# Patient Record
Sex: Female | Born: 1935 | Race: White | Hispanic: No
Health system: Southern US, Community
[De-identification: ages and names within clinical notes are randomized; demographics above are authoritative.]

---

## 2008-03-17 ENCOUNTER — Other Ambulatory Visit: Payer: Self-pay

## 2008-03-17 ENCOUNTER — Inpatient Hospital Stay: Payer: Self-pay | Admitting: Internal Medicine

## 2008-05-15 ENCOUNTER — Inpatient Hospital Stay: Payer: Self-pay | Admitting: Vascular Surgery

## 2008-11-14 ENCOUNTER — Emergency Department: Payer: Self-pay | Admitting: Emergency Medicine

## 2009-12-19 ENCOUNTER — Inpatient Hospital Stay: Payer: Self-pay | Admitting: Student

## 2010-04-24 ENCOUNTER — Ambulatory Visit: Payer: Self-pay | Admitting: Gynecologic Oncology

## 2010-05-07 ENCOUNTER — Ambulatory Visit: Payer: Self-pay | Admitting: Gynecologic Oncology

## 2010-05-24 ENCOUNTER — Ambulatory Visit: Payer: Self-pay | Admitting: Gynecologic Oncology

## 2010-07-25 ENCOUNTER — Emergency Department: Payer: Self-pay | Admitting: Emergency Medicine

## 2010-08-24 ENCOUNTER — Ambulatory Visit: Payer: Self-pay | Admitting: Internal Medicine

## 2010-09-06 ENCOUNTER — Ambulatory Visit: Payer: Self-pay | Admitting: Internal Medicine

## 2010-09-24 ENCOUNTER — Ambulatory Visit: Payer: Self-pay | Admitting: Internal Medicine

## 2010-10-25 ENCOUNTER — Ambulatory Visit: Payer: Self-pay | Admitting: Internal Medicine

## 2010-11-18 ENCOUNTER — Inpatient Hospital Stay: Payer: Self-pay | Admitting: Specialist

## 2010-11-24 ENCOUNTER — Ambulatory Visit: Payer: Self-pay | Admitting: Internal Medicine

## 2010-12-22 ENCOUNTER — Emergency Department: Payer: Self-pay | Admitting: Emergency Medicine

## 2011-03-18 ENCOUNTER — Ambulatory Visit: Payer: Self-pay | Admitting: Internal Medicine

## 2011-03-25 ENCOUNTER — Ambulatory Visit: Payer: Self-pay | Admitting: Internal Medicine

## 2011-07-22 ENCOUNTER — Ambulatory Visit: Payer: Self-pay | Admitting: Gynecologic Oncology

## 2011-08-19 ENCOUNTER — Ambulatory Visit: Payer: Self-pay | Admitting: Gynecologic Oncology

## 2011-08-25 ENCOUNTER — Ambulatory Visit: Payer: Self-pay | Admitting: Gynecologic Oncology

## 2011-09-25 ENCOUNTER — Ambulatory Visit: Payer: Self-pay | Admitting: Gynecologic Oncology

## 2011-12-30 ENCOUNTER — Ambulatory Visit: Payer: Self-pay | Admitting: Gynecologic Oncology

## 2012-01-09 LAB — PATHOLOGY REPORT

## 2012-01-23 ENCOUNTER — Ambulatory Visit: Payer: Self-pay | Admitting: Gynecologic Oncology

## 2012-04-12 ENCOUNTER — Ambulatory Visit: Payer: Self-pay | Admitting: Rheumatology

## 2012-05-04 ENCOUNTER — Ambulatory Visit: Payer: Self-pay | Admitting: Internal Medicine

## 2012-05-05 LAB — CA 125: CA 125: 23.4 U/mL (ref 0.0–34.0)

## 2012-05-24 ENCOUNTER — Ambulatory Visit: Payer: Self-pay | Admitting: Internal Medicine

## 2012-07-23 ENCOUNTER — Ambulatory Visit: Payer: Self-pay | Admitting: Internal Medicine

## 2013-03-15 ENCOUNTER — Ambulatory Visit: Payer: Self-pay | Admitting: Physician Assistant

## 2013-03-17 ENCOUNTER — Ambulatory Visit: Payer: Self-pay | Admitting: Physician Assistant

## 2013-10-25 ENCOUNTER — Ambulatory Visit: Payer: Self-pay | Admitting: Gynecologic Oncology

## 2013-11-29 ENCOUNTER — Ambulatory Visit: Payer: Self-pay | Admitting: Gynecologic Oncology

## 2013-12-25 ENCOUNTER — Ambulatory Visit: Payer: Self-pay | Admitting: Gynecologic Oncology

## 2014-11-13 ENCOUNTER — Ambulatory Visit: Payer: Self-pay

## 2014-11-19 ENCOUNTER — Inpatient Hospital Stay: Payer: Self-pay | Admitting: Internal Medicine

## 2014-11-19 LAB — CBC
HCT: 37.5 % (ref 35.0–47.0)
HGB: 12.1 g/dL (ref 12.0–16.0)
MCH: 31.9 pg (ref 26.0–34.0)
MCHC: 32.3 g/dL (ref 32.0–36.0)
MCV: 99 fL (ref 80–100)
Platelet: 110 10*3/uL — ABNORMAL LOW (ref 150–440)
RBC: 3.8 10*6/uL (ref 3.80–5.20)
RDW: 14.5 % (ref 11.5–14.5)
WBC: 6.8 10*3/uL (ref 3.6–11.0)

## 2014-11-19 LAB — BASIC METABOLIC PANEL
Anion Gap: 6 — ABNORMAL LOW (ref 7–16)
BUN: 32 mg/dL — ABNORMAL HIGH (ref 7–18)
CALCIUM: 8.2 mg/dL — AB (ref 8.5–10.1)
CHLORIDE: 100 mmol/L (ref 98–107)
CO2: 35 mmol/L — AB (ref 21–32)
Creatinine: 2.74 mg/dL — ABNORMAL HIGH (ref 0.60–1.30)
EGFR (African American): 21 — ABNORMAL LOW
GFR CALC NON AF AMER: 18 — AB
Glucose: 96 mg/dL (ref 65–99)
Osmolality: 288 (ref 275–301)
Potassium: 4.1 mmol/L (ref 3.5–5.1)
Sodium: 141 mmol/L (ref 136–145)

## 2014-11-19 LAB — TROPONIN I: Troponin-I: 0.03 ng/mL

## 2014-11-20 LAB — CBC WITH DIFFERENTIAL/PLATELET
Basophil #: 0 10*3/uL (ref 0.0–0.1)
Basophil %: 0.1 %
Eosinophil #: 0 10*3/uL (ref 0.0–0.7)
Eosinophil %: 0.1 %
HCT: 31.6 % — ABNORMAL LOW (ref 35.0–47.0)
HGB: 10.3 g/dL — ABNORMAL LOW (ref 12.0–16.0)
LYMPHS PCT: 23.9 %
Lymphocyte #: 1.6 10*3/uL (ref 1.0–3.6)
MCH: 32.1 pg (ref 26.0–34.0)
MCHC: 32.7 g/dL (ref 32.0–36.0)
MCV: 98 fL (ref 80–100)
Monocyte #: 0.6 x10 3/mm (ref 0.2–0.9)
Monocyte %: 9.3 %
NEUTROS PCT: 66.6 %
Neutrophil #: 4.4 10*3/uL (ref 1.4–6.5)
PLATELETS: 93 10*3/uL — AB (ref 150–440)
RBC: 3.22 10*6/uL — AB (ref 3.80–5.20)
RDW: 14.7 % — ABNORMAL HIGH (ref 11.5–14.5)
WBC: 6.6 10*3/uL (ref 3.6–11.0)

## 2014-11-20 LAB — BASIC METABOLIC PANEL
ANION GAP: 4 — AB (ref 7–16)
BUN: 29 mg/dL — AB (ref 7–18)
CO2: 36 mmol/L — AB (ref 21–32)
Calcium, Total: 7.8 mg/dL — ABNORMAL LOW (ref 8.5–10.1)
Chloride: 105 mmol/L (ref 98–107)
Creatinine: 2.32 mg/dL — ABNORMAL HIGH (ref 0.60–1.30)
GFR CALC AF AMER: 26 — AB
GFR CALC NON AF AMER: 21 — AB
Glucose: 78 mg/dL (ref 65–99)
Osmolality: 293 (ref 275–301)
Potassium: 3.9 mmol/L (ref 3.5–5.1)
SODIUM: 145 mmol/L (ref 136–145)

## 2014-11-21 LAB — CBC WITH DIFFERENTIAL/PLATELET
BASOS PCT: 0.2 %
Basophil #: 0 10*3/uL (ref 0.0–0.1)
EOS PCT: 0.1 %
Eosinophil #: 0 10*3/uL (ref 0.0–0.7)
HCT: 29 % — ABNORMAL LOW (ref 35.0–47.0)
HGB: 9.4 g/dL — ABNORMAL LOW (ref 12.0–16.0)
LYMPHS PCT: 19.6 %
Lymphocyte #: 1.2 10*3/uL (ref 1.0–3.6)
MCH: 32.1 pg (ref 26.0–34.0)
MCHC: 32.4 g/dL (ref 32.0–36.0)
MCV: 99 fL (ref 80–100)
MONOS PCT: 7.4 %
Monocyte #: 0.5 x10 3/mm (ref 0.2–0.9)
Neutrophil #: 4.6 10*3/uL (ref 1.4–6.5)
Neutrophil %: 72.7 %
PLATELETS: 90 10*3/uL — AB (ref 150–440)
RBC: 2.93 10*6/uL — ABNORMAL LOW (ref 3.80–5.20)
RDW: 14.5 % (ref 11.5–14.5)
WBC: 6.3 10*3/uL (ref 3.6–11.0)

## 2014-11-21 LAB — BASIC METABOLIC PANEL
Anion Gap: 4 — ABNORMAL LOW (ref 7–16)
BUN: 28 mg/dL — ABNORMAL HIGH (ref 7–18)
Calcium, Total: 7 mg/dL — CL (ref 8.5–10.1)
Chloride: 108 mmol/L — ABNORMAL HIGH (ref 98–107)
Co2: 33 mmol/L — ABNORMAL HIGH (ref 21–32)
Creatinine: 1.97 mg/dL — ABNORMAL HIGH (ref 0.60–1.30)
GFR CALC AF AMER: 31 — AB
GFR CALC NON AF AMER: 26 — AB
Glucose: 78 mg/dL (ref 65–99)
Osmolality: 293 (ref 275–301)
Potassium: 3.8 mmol/L (ref 3.5–5.1)
Sodium: 145 mmol/L (ref 136–145)

## 2014-11-22 LAB — CBC WITH DIFFERENTIAL/PLATELET
BASOS ABS: 0 10*3/uL (ref 0.0–0.1)
Basophil %: 0.1 %
Eosinophil #: 0 10*3/uL (ref 0.0–0.7)
Eosinophil %: 0 %
HCT: 30 % — ABNORMAL LOW (ref 35.0–47.0)
HGB: 9.6 g/dL — ABNORMAL LOW (ref 12.0–16.0)
LYMPHS ABS: 0.4 10*3/uL — AB (ref 1.0–3.6)
LYMPHS PCT: 7.4 %
MCH: 32 pg (ref 26.0–34.0)
MCHC: 32.1 g/dL (ref 32.0–36.0)
MCV: 100 fL (ref 80–100)
MONO ABS: 0.2 x10 3/mm (ref 0.2–0.9)
Monocyte %: 2.9 %
Neutrophil #: 4.8 10*3/uL (ref 1.4–6.5)
Neutrophil %: 89.6 %
Platelet: 86 10*3/uL — ABNORMAL LOW (ref 150–440)
RBC: 3.01 10*6/uL — ABNORMAL LOW (ref 3.80–5.20)
RDW: 14.5 % (ref 11.5–14.5)
WBC: 5.3 10*3/uL (ref 3.6–11.0)

## 2014-11-22 LAB — HEPATIC FUNCTION PANEL A (ARMC)
ALBUMIN: 1.8 g/dL — AB (ref 3.4–5.0)
ALK PHOS: 38 U/L — AB
BILIRUBIN TOTAL: 0.4 mg/dL (ref 0.2–1.0)
Bilirubin, Direct: <0.1 mg/dL (ref 0.0–0.2)
SGOT(AST): 16 U/L (ref 15–37)
SGPT (ALT): 13 U/L — ABNORMAL LOW
Total Protein: 4.6 g/dL — ABNORMAL LOW (ref 6.4–8.2)

## 2014-11-22 LAB — BASIC METABOLIC PANEL
Anion Gap: 4 — ABNORMAL LOW (ref 7–16)
BUN: 25 mg/dL — ABNORMAL HIGH (ref 7–18)
CHLORIDE: 109 mmol/L — AB (ref 98–107)
Calcium, Total: 7 mg/dL — CL (ref 8.5–10.1)
Co2: 30 mmol/L (ref 21–32)
Creatinine: 1.57 mg/dL — ABNORMAL HIGH (ref 0.60–1.30)
EGFR (African American): 41 — ABNORMAL LOW
EGFR (Non-African Amer.): 34 — ABNORMAL LOW
Glucose: 125 mg/dL — ABNORMAL HIGH (ref 65–99)
OSMOLALITY: 291 (ref 275–301)
Potassium: 4.3 mmol/L (ref 3.5–5.1)
Sodium: 143 mmol/L (ref 136–145)

## 2014-11-23 LAB — BASIC METABOLIC PANEL
ANION GAP: 5 — AB (ref 7–16)
BUN: 33 mg/dL — AB (ref 7–18)
CREATININE: 1.56 mg/dL — AB (ref 0.60–1.30)
Calcium, Total: 7.4 mg/dL — ABNORMAL LOW (ref 8.5–10.1)
Chloride: 105 mmol/L (ref 98–107)
Co2: 29 mmol/L (ref 21–32)
EGFR (Non-African Amer.): 34 — ABNORMAL LOW
GFR CALC AF AMER: 41 — AB
Glucose: 121 mg/dL — ABNORMAL HIGH (ref 65–99)
OSMOLALITY: 286 (ref 275–301)
POTASSIUM: 4.5 mmol/L (ref 3.5–5.1)
Sodium: 139 mmol/L (ref 136–145)

## 2014-11-24 ENCOUNTER — Ambulatory Visit
Admit: 2014-11-24 | Disposition: A | Discharge status: Home / Self Care | Payer: Self-pay | Attending: Nurse Practitioner | Admitting: Nurse Practitioner

## 2014-11-24 ENCOUNTER — Ambulatory Visit: Payer: Self-pay | Admitting: Internal Medicine

## 2014-11-24 DIAGNOSIS — N179 Acute kidney failure, unspecified: Secondary | ICD-10-CM | POA: Diagnosis not present

## 2014-11-24 DIAGNOSIS — M069 Rheumatoid arthritis, unspecified: Secondary | ICD-10-CM | POA: Diagnosis not present

## 2014-11-24 DIAGNOSIS — J449 Chronic obstructive pulmonary disease, unspecified: Secondary | ICD-10-CM | POA: Diagnosis not present

## 2014-11-24 DIAGNOSIS — J15212 Pneumonia due to Methicillin resistant Staphylococcus aureus: Secondary | ICD-10-CM | POA: Diagnosis not present

## 2014-11-24 DIAGNOSIS — J9809 Other diseases of bronchus, not elsewhere classified: Secondary | ICD-10-CM | POA: Diagnosis not present

## 2014-11-24 DIAGNOSIS — J9600 Acute respiratory failure, unspecified whether with hypoxia or hypercapnia: Secondary | ICD-10-CM | POA: Diagnosis not present

## 2014-11-24 DIAGNOSIS — R1312 Dysphagia, oropharyngeal phase: Secondary | ICD-10-CM | POA: Diagnosis not present

## 2014-11-24 LAB — CULTURE, BLOOD (SINGLE)

## 2014-11-25 DIAGNOSIS — R1312 Dysphagia, oropharyngeal phase: Secondary | ICD-10-CM | POA: Diagnosis not present

## 2014-11-25 DIAGNOSIS — M069 Rheumatoid arthritis, unspecified: Secondary | ICD-10-CM | POA: Diagnosis not present

## 2014-11-25 DIAGNOSIS — J9809 Other diseases of bronchus, not elsewhere classified: Secondary | ICD-10-CM | POA: Diagnosis not present

## 2014-11-25 DIAGNOSIS — N179 Acute kidney failure, unspecified: Secondary | ICD-10-CM | POA: Diagnosis not present

## 2014-11-25 LAB — BASIC METABOLIC PANEL
Anion Gap: 2 — ABNORMAL LOW (ref 7–16)
BUN: 42 mg/dL — AB (ref 7–18)
CHLORIDE: 104 mmol/L (ref 98–107)
CO2: 34 mmol/L — AB (ref 21–32)
Calcium, Total: 7.9 mg/dL — ABNORMAL LOW (ref 8.5–10.1)
Creatinine: 1.73 mg/dL — ABNORMAL HIGH (ref 0.60–1.30)
EGFR (African American): 36 — ABNORMAL LOW
EGFR (Non-African Amer.): 30 — ABNORMAL LOW
Glucose: 75 mg/dL (ref 65–99)
OSMOLALITY: 289 (ref 275–301)
POTASSIUM: 5 mmol/L (ref 3.5–5.1)
Sodium: 140 mmol/L (ref 136–145)

## 2014-11-26 DIAGNOSIS — R1312 Dysphagia, oropharyngeal phase: Secondary | ICD-10-CM | POA: Diagnosis not present

## 2014-11-26 DIAGNOSIS — N179 Acute kidney failure, unspecified: Secondary | ICD-10-CM | POA: Diagnosis not present

## 2014-11-26 DIAGNOSIS — M069 Rheumatoid arthritis, unspecified: Secondary | ICD-10-CM | POA: Diagnosis not present

## 2014-11-26 DIAGNOSIS — J9809 Other diseases of bronchus, not elsewhere classified: Secondary | ICD-10-CM | POA: Diagnosis not present

## 2014-11-26 LAB — CREATININE, SERUM
CREATININE: 1.67 mg/dL — AB (ref 0.60–1.30)
GFR CALC AF AMER: 38 — AB
GFR CALC NON AF AMER: 31 — AB

## 2014-11-27 DIAGNOSIS — J9809 Other diseases of bronchus, not elsewhere classified: Secondary | ICD-10-CM | POA: Diagnosis not present

## 2014-11-27 DIAGNOSIS — M069 Rheumatoid arthritis, unspecified: Secondary | ICD-10-CM | POA: Diagnosis not present

## 2014-11-27 DIAGNOSIS — R1312 Dysphagia, oropharyngeal phase: Secondary | ICD-10-CM | POA: Diagnosis not present

## 2014-11-27 DIAGNOSIS — R1314 Dysphagia, pharyngoesophageal phase: Secondary | ICD-10-CM | POA: Diagnosis not present

## 2014-11-27 DIAGNOSIS — J15212 Pneumonia due to Methicillin resistant Staphylococcus aureus: Secondary | ICD-10-CM | POA: Diagnosis not present

## 2014-11-27 DIAGNOSIS — N179 Acute kidney failure, unspecified: Secondary | ICD-10-CM | POA: Diagnosis not present

## 2014-11-27 DIAGNOSIS — Z9981 Dependence on supplemental oxygen: Secondary | ICD-10-CM | POA: Diagnosis not present

## 2014-11-28 DIAGNOSIS — J9690 Respiratory failure, unspecified, unspecified whether with hypoxia or hypercapnia: Secondary | ICD-10-CM | POA: Diagnosis not present

## 2014-11-28 DIAGNOSIS — R1314 Dysphagia, pharyngoesophageal phase: Secondary | ICD-10-CM | POA: Diagnosis not present

## 2014-11-28 DIAGNOSIS — H269 Unspecified cataract: Secondary | ICD-10-CM | POA: Diagnosis not present

## 2014-11-28 DIAGNOSIS — D649 Anemia, unspecified: Secondary | ICD-10-CM | POA: Diagnosis not present

## 2014-11-28 DIAGNOSIS — Z9013 Acquired absence of bilateral breasts and nipples: Secondary | ICD-10-CM | POA: Diagnosis not present

## 2014-11-28 DIAGNOSIS — K5900 Constipation, unspecified: Secondary | ICD-10-CM | POA: Diagnosis not present

## 2014-11-28 DIAGNOSIS — Z803 Family history of malignant neoplasm of breast: Secondary | ICD-10-CM | POA: Diagnosis not present

## 2014-11-28 DIAGNOSIS — J9809 Other diseases of bronchus, not elsewhere classified: Secondary | ICD-10-CM | POA: Diagnosis not present

## 2014-11-28 DIAGNOSIS — Z86718 Personal history of other venous thrombosis and embolism: Secondary | ICD-10-CM | POA: Diagnosis not present

## 2014-11-28 DIAGNOSIS — K449 Diaphragmatic hernia without obstruction or gangrene: Secondary | ICD-10-CM | POA: Diagnosis not present

## 2014-11-28 DIAGNOSIS — R0602 Shortness of breath: Secondary | ICD-10-CM | POA: Diagnosis not present

## 2014-11-28 DIAGNOSIS — Z8249 Family history of ischemic heart disease and other diseases of the circulatory system: Secondary | ICD-10-CM | POA: Diagnosis not present

## 2014-11-28 DIAGNOSIS — I10 Essential (primary) hypertension: Secondary | ICD-10-CM | POA: Diagnosis not present

## 2014-11-28 DIAGNOSIS — I2510 Atherosclerotic heart disease of native coronary artery without angina pectoris: Secondary | ICD-10-CM | POA: Diagnosis not present

## 2014-11-28 DIAGNOSIS — S37099S Other injury of unspecified kidney, sequela: Secondary | ICD-10-CM | POA: Diagnosis not present

## 2014-11-28 DIAGNOSIS — Z853 Personal history of malignant neoplasm of breast: Secondary | ICD-10-CM | POA: Diagnosis not present

## 2014-11-28 DIAGNOSIS — Z9981 Dependence on supplemental oxygen: Secondary | ICD-10-CM | POA: Diagnosis not present

## 2014-11-28 DIAGNOSIS — I129 Hypertensive chronic kidney disease with stage 1 through stage 4 chronic kidney disease, or unspecified chronic kidney disease: Secondary | ICD-10-CM | POA: Diagnosis not present

## 2014-11-28 DIAGNOSIS — R05 Cough: Secondary | ICD-10-CM | POA: Diagnosis not present

## 2014-11-28 DIAGNOSIS — I517 Cardiomegaly: Secondary | ICD-10-CM | POA: Diagnosis not present

## 2014-11-28 DIAGNOSIS — M1991 Primary osteoarthritis, unspecified site: Secondary | ICD-10-CM | POA: Diagnosis not present

## 2014-11-28 DIAGNOSIS — I509 Heart failure, unspecified: Secondary | ICD-10-CM | POA: Diagnosis not present

## 2014-11-28 DIAGNOSIS — J988 Other specified respiratory disorders: Secondary | ICD-10-CM | POA: Diagnosis not present

## 2014-11-28 DIAGNOSIS — N19 Unspecified kidney failure: Secondary | ICD-10-CM | POA: Diagnosis not present

## 2014-11-28 DIAGNOSIS — K219 Gastro-esophageal reflux disease without esophagitis: Secondary | ICD-10-CM | POA: Diagnosis not present

## 2014-11-28 DIAGNOSIS — Z823 Family history of stroke: Secondary | ICD-10-CM | POA: Diagnosis not present

## 2014-11-28 DIAGNOSIS — Z8701 Personal history of pneumonia (recurrent): Secondary | ICD-10-CM | POA: Diagnosis not present

## 2014-11-28 DIAGNOSIS — I5031 Acute diastolic (congestive) heart failure: Secondary | ICD-10-CM | POA: Diagnosis not present

## 2014-11-28 DIAGNOSIS — J189 Pneumonia, unspecified organism: Secondary | ICD-10-CM | POA: Diagnosis not present

## 2014-11-28 DIAGNOSIS — J449 Chronic obstructive pulmonary disease, unspecified: Secondary | ICD-10-CM | POA: Diagnosis not present

## 2014-11-28 DIAGNOSIS — N182 Chronic kidney disease, stage 2 (mild): Secondary | ICD-10-CM | POA: Diagnosis not present

## 2014-11-28 DIAGNOSIS — I248 Other forms of acute ischemic heart disease: Secondary | ICD-10-CM | POA: Diagnosis not present

## 2014-11-28 DIAGNOSIS — M4800 Spinal stenosis, site unspecified: Secondary | ICD-10-CM | POA: Diagnosis not present

## 2014-11-28 DIAGNOSIS — R1312 Dysphagia, oropharyngeal phase: Secondary | ICD-10-CM | POA: Diagnosis not present

## 2014-11-28 DIAGNOSIS — M6281 Muscle weakness (generalized): Secondary | ICD-10-CM | POA: Diagnosis not present

## 2014-11-28 DIAGNOSIS — R279 Unspecified lack of coordination: Secondary | ICD-10-CM | POA: Diagnosis not present

## 2014-11-28 DIAGNOSIS — Z8614 Personal history of Methicillin resistant Staphylococcus aureus infection: Secondary | ICD-10-CM | POA: Diagnosis not present

## 2014-11-28 DIAGNOSIS — R531 Weakness: Secondary | ICD-10-CM | POA: Diagnosis not present

## 2014-11-28 DIAGNOSIS — J15212 Pneumonia due to Methicillin resistant Staphylococcus aureus: Secondary | ICD-10-CM | POA: Diagnosis not present

## 2014-11-28 DIAGNOSIS — I340 Nonrheumatic mitral (valve) insufficiency: Secondary | ICD-10-CM | POA: Diagnosis not present

## 2014-11-28 DIAGNOSIS — R7989 Other specified abnormal findings of blood chemistry: Secondary | ICD-10-CM | POA: Diagnosis not present

## 2014-11-28 DIAGNOSIS — M069 Rheumatoid arthritis, unspecified: Secondary | ICD-10-CM | POA: Diagnosis not present

## 2014-11-28 DIAGNOSIS — Z8673 Personal history of transient ischemic attack (TIA), and cerebral infarction without residual deficits: Secondary | ICD-10-CM | POA: Diagnosis not present

## 2014-11-28 DIAGNOSIS — M810 Age-related osteoporosis without current pathological fracture: Secondary | ICD-10-CM | POA: Diagnosis not present

## 2014-11-28 DIAGNOSIS — J9801 Acute bronchospasm: Secondary | ICD-10-CM | POA: Diagnosis not present

## 2014-11-28 DIAGNOSIS — Z96653 Presence of artificial knee joint, bilateral: Secondary | ICD-10-CM | POA: Diagnosis not present

## 2014-11-28 DIAGNOSIS — M1990 Unspecified osteoarthritis, unspecified site: Secondary | ICD-10-CM | POA: Diagnosis not present

## 2014-11-28 DIAGNOSIS — R4702 Dysphasia: Secondary | ICD-10-CM | POA: Diagnosis not present

## 2014-11-28 DIAGNOSIS — J90 Pleural effusion, not elsewhere classified: Secondary | ICD-10-CM | POA: Diagnosis not present

## 2014-11-28 DIAGNOSIS — Z8542 Personal history of malignant neoplasm of other parts of uterus: Secondary | ICD-10-CM | POA: Diagnosis not present

## 2014-11-28 DIAGNOSIS — N179 Acute kidney failure, unspecified: Secondary | ICD-10-CM | POA: Diagnosis not present

## 2014-11-28 LAB — CREATININE, SERUM
Creatinine: 1.52 mg/dL — ABNORMAL HIGH (ref 0.60–1.30)
EGFR (Non-African Amer.): 35 — ABNORMAL LOW
GFR CALC AF AMER: 42 — AB

## 2014-11-28 LAB — EXPECTORATED SPUTUM ASSESSMENT W REFEX TO RESP CULTURE

## 2014-12-01 DIAGNOSIS — J15212 Pneumonia due to Methicillin resistant Staphylococcus aureus: Secondary | ICD-10-CM | POA: Diagnosis not present

## 2014-12-01 DIAGNOSIS — J9801 Acute bronchospasm: Secondary | ICD-10-CM | POA: Diagnosis not present

## 2014-12-01 DIAGNOSIS — R1314 Dysphagia, pharyngoesophageal phase: Secondary | ICD-10-CM | POA: Diagnosis not present

## 2014-12-01 DIAGNOSIS — M069 Rheumatoid arthritis, unspecified: Secondary | ICD-10-CM | POA: Diagnosis not present

## 2014-12-04 ENCOUNTER — Inpatient Hospital Stay: Payer: Self-pay | Admitting: Internal Medicine

## 2014-12-04 DIAGNOSIS — Z8701 Personal history of pneumonia (recurrent): Secondary | ICD-10-CM | POA: Diagnosis not present

## 2014-12-04 DIAGNOSIS — Z8542 Personal history of malignant neoplasm of other parts of uterus: Secondary | ICD-10-CM | POA: Diagnosis not present

## 2014-12-04 DIAGNOSIS — Z5189 Encounter for other specified aftercare: Secondary | ICD-10-CM | POA: Diagnosis not present

## 2014-12-04 DIAGNOSIS — Z8673 Personal history of transient ischemic attack (TIA), and cerebral infarction without residual deficits: Secondary | ICD-10-CM | POA: Diagnosis not present

## 2014-12-04 DIAGNOSIS — R1312 Dysphagia, oropharyngeal phase: Secondary | ICD-10-CM | POA: Diagnosis not present

## 2014-12-04 DIAGNOSIS — Z853 Personal history of malignant neoplasm of breast: Secondary | ICD-10-CM | POA: Diagnosis not present

## 2014-12-04 DIAGNOSIS — Z9889 Other specified postprocedural states: Secondary | ICD-10-CM | POA: Diagnosis not present

## 2014-12-04 DIAGNOSIS — A4902 Methicillin resistant Staphylococcus aureus infection, unspecified site: Secondary | ICD-10-CM | POA: Diagnosis not present

## 2014-12-04 DIAGNOSIS — J189 Pneumonia, unspecified organism: Secondary | ICD-10-CM | POA: Diagnosis not present

## 2014-12-04 DIAGNOSIS — Z9013 Acquired absence of bilateral breasts and nipples: Secondary | ICD-10-CM | POA: Diagnosis not present

## 2014-12-04 DIAGNOSIS — I129 Hypertensive chronic kidney disease with stage 1 through stage 4 chronic kidney disease, or unspecified chronic kidney disease: Secondary | ICD-10-CM | POA: Diagnosis not present

## 2014-12-04 DIAGNOSIS — J15212 Pneumonia due to Methicillin resistant Staphylococcus aureus: Secondary | ICD-10-CM | POA: Diagnosis not present

## 2014-12-04 DIAGNOSIS — I340 Nonrheumatic mitral (valve) insufficiency: Secondary | ICD-10-CM | POA: Diagnosis not present

## 2014-12-04 DIAGNOSIS — J441 Chronic obstructive pulmonary disease with (acute) exacerbation: Secondary | ICD-10-CM | POA: Diagnosis not present

## 2014-12-04 DIAGNOSIS — J449 Chronic obstructive pulmonary disease, unspecified: Secondary | ICD-10-CM | POA: Diagnosis not present

## 2014-12-04 DIAGNOSIS — N182 Chronic kidney disease, stage 2 (mild): Secondary | ICD-10-CM | POA: Diagnosis not present

## 2014-12-04 DIAGNOSIS — Z823 Family history of stroke: Secondary | ICD-10-CM | POA: Diagnosis not present

## 2014-12-04 DIAGNOSIS — I509 Heart failure, unspecified: Secondary | ICD-10-CM | POA: Diagnosis not present

## 2014-12-04 DIAGNOSIS — M810 Age-related osteoporosis without current pathological fracture: Secondary | ICD-10-CM | POA: Diagnosis not present

## 2014-12-04 DIAGNOSIS — M069 Rheumatoid arthritis, unspecified: Secondary | ICD-10-CM | POA: Diagnosis not present

## 2014-12-04 DIAGNOSIS — M6281 Muscle weakness (generalized): Secondary | ICD-10-CM | POA: Diagnosis not present

## 2014-12-04 DIAGNOSIS — Z9981 Dependence on supplemental oxygen: Secondary | ICD-10-CM | POA: Diagnosis not present

## 2014-12-04 DIAGNOSIS — Z86718 Personal history of other venous thrombosis and embolism: Secondary | ICD-10-CM | POA: Diagnosis not present

## 2014-12-04 DIAGNOSIS — R0602 Shortness of breath: Secondary | ICD-10-CM | POA: Diagnosis not present

## 2014-12-04 DIAGNOSIS — J90 Pleural effusion, not elsewhere classified: Secondary | ICD-10-CM | POA: Diagnosis not present

## 2014-12-04 DIAGNOSIS — I248 Other forms of acute ischemic heart disease: Secondary | ICD-10-CM | POA: Diagnosis not present

## 2014-12-04 DIAGNOSIS — H269 Unspecified cataract: Secondary | ICD-10-CM | POA: Diagnosis not present

## 2014-12-04 DIAGNOSIS — I10 Essential (primary) hypertension: Secondary | ICD-10-CM | POA: Diagnosis not present

## 2014-12-04 DIAGNOSIS — Z7689 Persons encountering health services in other specified circumstances: Secondary | ICD-10-CM | POA: Diagnosis not present

## 2014-12-04 DIAGNOSIS — Z8249 Family history of ischemic heart disease and other diseases of the circulatory system: Secondary | ICD-10-CM | POA: Diagnosis not present

## 2014-12-04 DIAGNOSIS — Z8614 Personal history of Methicillin resistant Staphylococcus aureus infection: Secondary | ICD-10-CM | POA: Diagnosis not present

## 2014-12-04 DIAGNOSIS — M4800 Spinal stenosis, site unspecified: Secondary | ICD-10-CM | POA: Diagnosis not present

## 2014-12-04 DIAGNOSIS — I2510 Atherosclerotic heart disease of native coronary artery without angina pectoris: Secondary | ICD-10-CM | POA: Diagnosis not present

## 2014-12-04 DIAGNOSIS — R05 Cough: Secondary | ICD-10-CM | POA: Diagnosis not present

## 2014-12-04 DIAGNOSIS — Z96653 Presence of artificial knee joint, bilateral: Secondary | ICD-10-CM | POA: Diagnosis not present

## 2014-12-04 DIAGNOSIS — I5031 Acute diastolic (congestive) heart failure: Secondary | ICD-10-CM | POA: Diagnosis not present

## 2014-12-04 DIAGNOSIS — R531 Weakness: Secondary | ICD-10-CM | POA: Diagnosis not present

## 2014-12-04 DIAGNOSIS — M1990 Unspecified osteoarthritis, unspecified site: Secondary | ICD-10-CM | POA: Diagnosis not present

## 2014-12-04 DIAGNOSIS — Z803 Family history of malignant neoplasm of breast: Secondary | ICD-10-CM | POA: Diagnosis not present

## 2014-12-04 DIAGNOSIS — G8929 Other chronic pain: Secondary | ICD-10-CM | POA: Diagnosis not present

## 2014-12-04 DIAGNOSIS — J9690 Respiratory failure, unspecified, unspecified whether with hypoxia or hypercapnia: Secondary | ICD-10-CM | POA: Diagnosis not present

## 2014-12-04 DIAGNOSIS — R7989 Other specified abnormal findings of blood chemistry: Secondary | ICD-10-CM | POA: Diagnosis not present

## 2014-12-04 DIAGNOSIS — I517 Cardiomegaly: Secondary | ICD-10-CM | POA: Diagnosis not present

## 2014-12-04 LAB — PRO B NATRIURETIC PEPTIDE: B-TYPE NATIURETIC PEPTID: 17597 pg/mL — AB (ref 0–450)

## 2014-12-04 LAB — COMPREHENSIVE METABOLIC PANEL
ALK PHOS: 52 U/L
AST: 47 U/L — AB (ref 15–37)
Albumin: 2.2 g/dL — ABNORMAL LOW (ref 3.4–5.0)
Anion Gap: 5 — ABNORMAL LOW (ref 7–16)
BILIRUBIN TOTAL: 0.9 mg/dL (ref 0.2–1.0)
BUN: 15 mg/dL (ref 7–18)
CALCIUM: 8.5 mg/dL (ref 8.5–10.1)
Chloride: 103 mmol/L (ref 98–107)
Co2: 30 mmol/L (ref 21–32)
Creatinine: 1.25 mg/dL (ref 0.60–1.30)
EGFR (Non-African Amer.): 44 — ABNORMAL LOW
GFR CALC AF AMER: 53 — AB
Glucose: 96 mg/dL (ref 65–99)
Osmolality: 276 (ref 275–301)
POTASSIUM: 5.1 mmol/L (ref 3.5–5.1)
SGPT (ALT): 26 U/L
Sodium: 138 mmol/L (ref 136–145)
Total Protein: 5.8 g/dL — ABNORMAL LOW (ref 6.4–8.2)

## 2014-12-04 LAB — CBC
HCT: 37.4 % (ref 35.0–47.0)
HGB: 12.1 g/dL (ref 12.0–16.0)
MCH: 31.8 pg (ref 26.0–34.0)
MCHC: 32.3 g/dL (ref 32.0–36.0)
MCV: 98 fL (ref 80–100)
PLATELETS: 160 10*3/uL (ref 150–440)
RBC: 3.8 10*6/uL (ref 3.80–5.20)
RDW: 14.3 % (ref 11.5–14.5)
WBC: 9.6 10*3/uL (ref 3.6–11.0)

## 2014-12-04 LAB — TROPONIN I
Troponin-I: 0.1 ng/mL — ABNORMAL HIGH
Troponin-I: 0.11 ng/mL — ABNORMAL HIGH
Troponin-I: 0.13 ng/mL — ABNORMAL HIGH

## 2014-12-04 LAB — PROTIME-INR
INR: 1
Prothrombin Time: 12.8 secs (ref 11.5–14.7)

## 2014-12-04 LAB — CK TOTAL AND CKMB (NOT AT ARMC)
CK, TOTAL: 106 U/L (ref 26–192)
CK-MB: 3.4 ng/mL (ref 0.5–3.6)

## 2014-12-05 LAB — CBC WITH DIFFERENTIAL/PLATELET
BASOS ABS: 0 10*3/uL (ref 0.0–0.1)
BASOS PCT: 0.3 %
Eosinophil #: 0 10*3/uL (ref 0.0–0.7)
Eosinophil %: 0.1 %
HCT: 29.7 % — ABNORMAL LOW (ref 35.0–47.0)
HGB: 9.5 g/dL — AB (ref 12.0–16.0)
LYMPHS ABS: 1.4 10*3/uL (ref 1.0–3.6)
Lymphocyte %: 18.9 %
MCH: 31.7 pg (ref 26.0–34.0)
MCHC: 32 g/dL (ref 32.0–36.0)
MCV: 99 fL (ref 80–100)
MONO ABS: 0.6 x10 3/mm (ref 0.2–0.9)
Monocyte %: 7.6 %
NEUTROS PCT: 73.1 %
Neutrophil #: 5.3 10*3/uL (ref 1.4–6.5)
PLATELETS: 117 10*3/uL — AB (ref 150–440)
RBC: 3 10*6/uL — ABNORMAL LOW (ref 3.80–5.20)
RDW: 14.2 % (ref 11.5–14.5)
WBC: 7.3 10*3/uL (ref 3.6–11.0)

## 2014-12-06 LAB — BASIC METABOLIC PANEL
Anion Gap: 6 — ABNORMAL LOW (ref 7–16)
BUN: 20 mg/dL — AB (ref 7–18)
Calcium, Total: 7.6 mg/dL — ABNORMAL LOW (ref 8.5–10.1)
Chloride: 100 mmol/L (ref 98–107)
Co2: 34 mmol/L — ABNORMAL HIGH (ref 21–32)
Creatinine: 1.76 mg/dL — ABNORMAL HIGH (ref 0.60–1.30)
GFR CALC AF AMER: 36 — AB
GFR CALC NON AF AMER: 29 — AB
GLUCOSE: 86 mg/dL (ref 65–99)
Osmolality: 281 (ref 275–301)
Potassium: 3.9 mmol/L (ref 3.5–5.1)
SODIUM: 140 mmol/L (ref 136–145)

## 2014-12-07 LAB — BASIC METABOLIC PANEL
ANION GAP: 2 — AB (ref 7–16)
BUN: 23 mg/dL — AB (ref 7–18)
CALCIUM: 7.9 mg/dL — AB (ref 8.5–10.1)
CHLORIDE: 102 mmol/L (ref 98–107)
Co2: 37 mmol/L — ABNORMAL HIGH (ref 21–32)
Creatinine: 1.69 mg/dL — ABNORMAL HIGH (ref 0.60–1.30)
EGFR (African American): 37 — ABNORMAL LOW
EGFR (Non-African Amer.): 31 — ABNORMAL LOW
Glucose: 102 mg/dL — ABNORMAL HIGH (ref 65–99)
Osmolality: 285 (ref 275–301)
POTASSIUM: 4.1 mmol/L (ref 3.5–5.1)
Sodium: 141 mmol/L (ref 136–145)

## 2014-12-07 LAB — CLOSTRIDIUM DIFFICILE(ARMC)

## 2014-12-08 DIAGNOSIS — J15212 Pneumonia due to Methicillin resistant Staphylococcus aureus: Secondary | ICD-10-CM | POA: Diagnosis not present

## 2014-12-08 DIAGNOSIS — R700 Elevated erythrocyte sedimentation rate: Secondary | ICD-10-CM | POA: Diagnosis not present

## 2014-12-08 DIAGNOSIS — J441 Chronic obstructive pulmonary disease with (acute) exacerbation: Secondary | ICD-10-CM | POA: Diagnosis not present

## 2014-12-08 DIAGNOSIS — I519 Heart disease, unspecified: Secondary | ICD-10-CM | POA: Diagnosis not present

## 2014-12-08 DIAGNOSIS — R918 Other nonspecific abnormal finding of lung field: Secondary | ICD-10-CM | POA: Diagnosis not present

## 2014-12-08 DIAGNOSIS — M79672 Pain in left foot: Secondary | ICD-10-CM | POA: Diagnosis not present

## 2014-12-08 DIAGNOSIS — K5900 Constipation, unspecified: Secondary | ICD-10-CM | POA: Diagnosis not present

## 2014-12-08 DIAGNOSIS — Z8614 Personal history of Methicillin resistant Staphylococcus aureus infection: Secondary | ICD-10-CM | POA: Diagnosis not present

## 2014-12-08 DIAGNOSIS — I2510 Atherosclerotic heart disease of native coronary artery without angina pectoris: Secondary | ICD-10-CM | POA: Diagnosis not present

## 2014-12-08 DIAGNOSIS — J90 Pleural effusion, not elsewhere classified: Secondary | ICD-10-CM | POA: Diagnosis not present

## 2014-12-08 DIAGNOSIS — D509 Iron deficiency anemia, unspecified: Secondary | ICD-10-CM | POA: Diagnosis not present

## 2014-12-08 DIAGNOSIS — Z5189 Encounter for other specified aftercare: Secondary | ICD-10-CM | POA: Diagnosis not present

## 2014-12-08 DIAGNOSIS — B351 Tinea unguium: Secondary | ICD-10-CM | POA: Diagnosis not present

## 2014-12-08 DIAGNOSIS — D631 Anemia in chronic kidney disease: Secondary | ICD-10-CM | POA: Diagnosis not present

## 2014-12-08 DIAGNOSIS — D649 Anemia, unspecified: Secondary | ICD-10-CM | POA: Diagnosis not present

## 2014-12-08 DIAGNOSIS — R0602 Shortness of breath: Secondary | ICD-10-CM | POA: Diagnosis not present

## 2014-12-08 DIAGNOSIS — Z9889 Other specified postprocedural states: Secondary | ICD-10-CM | POA: Diagnosis not present

## 2014-12-08 DIAGNOSIS — J189 Pneumonia, unspecified organism: Secondary | ICD-10-CM | POA: Diagnosis not present

## 2014-12-08 DIAGNOSIS — Z853 Personal history of malignant neoplasm of breast: Secondary | ICD-10-CM | POA: Diagnosis not present

## 2014-12-08 DIAGNOSIS — I10 Essential (primary) hypertension: Secondary | ICD-10-CM | POA: Diagnosis not present

## 2014-12-08 DIAGNOSIS — J9809 Other diseases of bronchus, not elsewhere classified: Secondary | ICD-10-CM | POA: Diagnosis not present

## 2014-12-08 DIAGNOSIS — J449 Chronic obstructive pulmonary disease, unspecified: Secondary | ICD-10-CM | POA: Diagnosis not present

## 2014-12-08 DIAGNOSIS — I252 Old myocardial infarction: Secondary | ICD-10-CM | POA: Diagnosis not present

## 2014-12-08 DIAGNOSIS — R0902 Hypoxemia: Secondary | ICD-10-CM | POA: Diagnosis not present

## 2014-12-08 DIAGNOSIS — A4902 Methicillin resistant Staphylococcus aureus infection, unspecified site: Secondary | ICD-10-CM | POA: Diagnosis not present

## 2014-12-08 DIAGNOSIS — M6281 Muscle weakness (generalized): Secondary | ICD-10-CM | POA: Diagnosis not present

## 2014-12-08 DIAGNOSIS — R0609 Other forms of dyspnea: Secondary | ICD-10-CM | POA: Diagnosis not present

## 2014-12-08 DIAGNOSIS — Z8542 Personal history of malignant neoplasm of other parts of uterus: Secondary | ICD-10-CM | POA: Diagnosis not present

## 2014-12-08 DIAGNOSIS — R634 Abnormal weight loss: Secondary | ICD-10-CM | POA: Diagnosis not present

## 2014-12-08 DIAGNOSIS — R05 Cough: Secondary | ICD-10-CM | POA: Diagnosis not present

## 2014-12-08 DIAGNOSIS — M159 Polyosteoarthritis, unspecified: Secondary | ICD-10-CM | POA: Diagnosis not present

## 2014-12-08 DIAGNOSIS — R1312 Dysphagia, oropharyngeal phase: Secondary | ICD-10-CM | POA: Diagnosis not present

## 2014-12-08 DIAGNOSIS — R531 Weakness: Secondary | ICD-10-CM | POA: Diagnosis not present

## 2014-12-08 DIAGNOSIS — M79671 Pain in right foot: Secondary | ICD-10-CM | POA: Diagnosis not present

## 2014-12-08 DIAGNOSIS — Z7689 Persons encountering health services in other specified circumstances: Secondary | ICD-10-CM | POA: Diagnosis not present

## 2014-12-08 DIAGNOSIS — G933 Postviral fatigue syndrome: Secondary | ICD-10-CM | POA: Diagnosis not present

## 2014-12-08 DIAGNOSIS — G8929 Other chronic pain: Secondary | ICD-10-CM | POA: Diagnosis not present

## 2014-12-08 DIAGNOSIS — R938 Abnormal findings on diagnostic imaging of other specified body structures: Secondary | ICD-10-CM | POA: Diagnosis not present

## 2014-12-08 DIAGNOSIS — Z79899 Other long term (current) drug therapy: Secondary | ICD-10-CM | POA: Diagnosis not present

## 2014-12-08 DIAGNOSIS — I509 Heart failure, unspecified: Secondary | ICD-10-CM | POA: Diagnosis not present

## 2014-12-08 DIAGNOSIS — R2231 Localized swelling, mass and lump, right upper limb: Secondary | ICD-10-CM | POA: Diagnosis not present

## 2014-12-08 DIAGNOSIS — M069 Rheumatoid arthritis, unspecified: Secondary | ICD-10-CM | POA: Diagnosis not present

## 2014-12-08 DIAGNOSIS — I517 Cardiomegaly: Secondary | ICD-10-CM | POA: Diagnosis not present

## 2014-12-08 DIAGNOSIS — J45909 Unspecified asthma, uncomplicated: Secondary | ICD-10-CM | POA: Diagnosis not present

## 2014-12-08 LAB — BODY FLUID CELL COUNT WITH DIFFERENTIAL
Basophil: 0 %
Eosinophil: 0 %
LYMPHS PCT: 27 %
NUCLEATED CELL COUNT: 606 /mm3
Neutrophils: 11 %
OTHER CELLS BF: 0 %
Other Mononuclear Cells: 62 %

## 2014-12-08 LAB — PROTEIN, BODY FLUID: PROTEIN, BODY FLUID: 1.6 g/dL

## 2014-12-08 LAB — AMYLASE, BODY FLUID: Amylase, Body Fluid: 23 U/L

## 2014-12-08 LAB — LACTATE DEHYDROGENASE, PLEURAL OR PERITONEAL FLUID: LDH, BODY FLUID: 114 U/L

## 2014-12-08 LAB — ALBUMIN, FLUID (OTHER): BODY FLUID ALBUMIN: 0.9 g/dL

## 2014-12-08 LAB — GLUCOSE, SEROUS FLUID: GLUCOSE, BODY FLUID: 115 mg/dL

## 2014-12-09 LAB — CULTURE, BLOOD (SINGLE)

## 2014-12-10 LAB — EXPECTORATED SPUTUM ASSESSMENT W GRAM STAIN, RFLX TO RESP C

## 2014-12-12 DIAGNOSIS — I10 Essential (primary) hypertension: Secondary | ICD-10-CM | POA: Diagnosis not present

## 2014-12-12 DIAGNOSIS — I509 Heart failure, unspecified: Secondary | ICD-10-CM | POA: Diagnosis not present

## 2014-12-12 DIAGNOSIS — D649 Anemia, unspecified: Secondary | ICD-10-CM | POA: Diagnosis not present

## 2014-12-12 DIAGNOSIS — M159 Polyosteoarthritis, unspecified: Secondary | ICD-10-CM | POA: Diagnosis not present

## 2014-12-12 LAB — BODY FLUID CULTURE

## 2014-12-22 DIAGNOSIS — I509 Heart failure, unspecified: Secondary | ICD-10-CM | POA: Diagnosis not present

## 2014-12-22 DIAGNOSIS — G933 Postviral fatigue syndrome: Secondary | ICD-10-CM | POA: Diagnosis not present

## 2014-12-22 DIAGNOSIS — J449 Chronic obstructive pulmonary disease, unspecified: Secondary | ICD-10-CM | POA: Diagnosis not present

## 2014-12-22 DIAGNOSIS — M069 Rheumatoid arthritis, unspecified: Secondary | ICD-10-CM | POA: Diagnosis not present

## 2014-12-25 ENCOUNTER — Ambulatory Visit: Payer: Self-pay | Admitting: Internal Medicine

## 2015-01-04 DIAGNOSIS — Z9889 Other specified postprocedural states: Secondary | ICD-10-CM | POA: Diagnosis not present

## 2015-01-04 DIAGNOSIS — R634 Abnormal weight loss: Secondary | ICD-10-CM | POA: Diagnosis not present

## 2015-01-04 DIAGNOSIS — R2231 Localized swelling, mass and lump, right upper limb: Secondary | ICD-10-CM | POA: Diagnosis not present

## 2015-01-04 DIAGNOSIS — J90 Pleural effusion, not elsewhere classified: Secondary | ICD-10-CM | POA: Diagnosis not present

## 2015-01-04 DIAGNOSIS — J189 Pneumonia, unspecified organism: Secondary | ICD-10-CM | POA: Diagnosis not present

## 2015-01-04 DIAGNOSIS — I517 Cardiomegaly: Secondary | ICD-10-CM | POA: Diagnosis not present

## 2015-01-04 DIAGNOSIS — I519 Heart disease, unspecified: Secondary | ICD-10-CM | POA: Diagnosis not present

## 2015-01-04 DIAGNOSIS — R0609 Other forms of dyspnea: Secondary | ICD-10-CM | POA: Diagnosis not present

## 2015-01-11 ENCOUNTER — Ambulatory Visit: Payer: Self-pay | Admitting: Specialist

## 2015-01-11 DIAGNOSIS — I2510 Atherosclerotic heart disease of native coronary artery without angina pectoris: Secondary | ICD-10-CM | POA: Diagnosis not present

## 2015-01-11 DIAGNOSIS — Z853 Personal history of malignant neoplasm of breast: Secondary | ICD-10-CM | POA: Diagnosis not present

## 2015-01-11 DIAGNOSIS — J45909 Unspecified asthma, uncomplicated: Secondary | ICD-10-CM | POA: Diagnosis not present

## 2015-01-11 DIAGNOSIS — J9809 Other diseases of bronchus, not elsewhere classified: Secondary | ICD-10-CM | POA: Diagnosis not present

## 2015-01-14 DIAGNOSIS — D649 Anemia, unspecified: Secondary | ICD-10-CM | POA: Diagnosis not present

## 2015-01-14 DIAGNOSIS — J449 Chronic obstructive pulmonary disease, unspecified: Secondary | ICD-10-CM | POA: Diagnosis not present

## 2015-01-14 DIAGNOSIS — I509 Heart failure, unspecified: Secondary | ICD-10-CM | POA: Diagnosis not present

## 2015-01-14 DIAGNOSIS — M069 Rheumatoid arthritis, unspecified: Secondary | ICD-10-CM | POA: Diagnosis not present

## 2015-01-16 ENCOUNTER — Ambulatory Visit: Payer: Self-pay | Admitting: Internal Medicine

## 2015-01-17 DIAGNOSIS — R05 Cough: Secondary | ICD-10-CM | POA: Diagnosis not present

## 2015-01-17 DIAGNOSIS — R938 Abnormal findings on diagnostic imaging of other specified body structures: Secondary | ICD-10-CM | POA: Diagnosis not present

## 2015-01-17 DIAGNOSIS — J90 Pleural effusion, not elsewhere classified: Secondary | ICD-10-CM | POA: Diagnosis not present

## 2015-01-17 DIAGNOSIS — R918 Other nonspecific abnormal finding of lung field: Secondary | ICD-10-CM | POA: Diagnosis not present

## 2015-01-18 DIAGNOSIS — B351 Tinea unguium: Secondary | ICD-10-CM | POA: Diagnosis not present

## 2015-01-18 DIAGNOSIS — M79671 Pain in right foot: Secondary | ICD-10-CM | POA: Diagnosis not present

## 2015-01-18 DIAGNOSIS — M79672 Pain in left foot: Secondary | ICD-10-CM | POA: Diagnosis not present

## 2015-01-22 DIAGNOSIS — Z79899 Other long term (current) drug therapy: Secondary | ICD-10-CM | POA: Diagnosis not present

## 2015-01-22 DIAGNOSIS — M069 Rheumatoid arthritis, unspecified: Secondary | ICD-10-CM | POA: Diagnosis not present

## 2015-01-22 DIAGNOSIS — Z853 Personal history of malignant neoplasm of breast: Secondary | ICD-10-CM | POA: Diagnosis not present

## 2015-01-22 DIAGNOSIS — K5900 Constipation, unspecified: Secondary | ICD-10-CM | POA: Diagnosis not present

## 2015-01-22 DIAGNOSIS — I10 Essential (primary) hypertension: Secondary | ICD-10-CM | POA: Diagnosis not present

## 2015-01-22 DIAGNOSIS — Z8614 Personal history of Methicillin resistant Staphylococcus aureus infection: Secondary | ICD-10-CM | POA: Diagnosis not present

## 2015-01-22 DIAGNOSIS — I252 Old myocardial infarction: Secondary | ICD-10-CM | POA: Diagnosis not present

## 2015-01-22 DIAGNOSIS — D509 Iron deficiency anemia, unspecified: Secondary | ICD-10-CM | POA: Diagnosis not present

## 2015-01-23 ENCOUNTER — Ambulatory Visit
Admit: 2015-01-23 | Disposition: A | Discharge status: Home / Self Care | Payer: Self-pay | Attending: Hematology and Oncology | Admitting: Hematology and Oncology

## 2015-01-25 DIAGNOSIS — D509 Iron deficiency anemia, unspecified: Secondary | ICD-10-CM | POA: Diagnosis not present

## 2015-01-25 DIAGNOSIS — Z79899 Other long term (current) drug therapy: Secondary | ICD-10-CM | POA: Diagnosis not present

## 2015-01-25 DIAGNOSIS — D631 Anemia in chronic kidney disease: Secondary | ICD-10-CM | POA: Diagnosis not present

## 2015-01-25 DIAGNOSIS — Z853 Personal history of malignant neoplasm of breast: Secondary | ICD-10-CM | POA: Diagnosis not present

## 2015-01-25 DIAGNOSIS — Z8614 Personal history of Methicillin resistant Staphylococcus aureus infection: Secondary | ICD-10-CM | POA: Diagnosis not present

## 2015-01-25 DIAGNOSIS — Z8542 Personal history of malignant neoplasm of other parts of uterus: Secondary | ICD-10-CM | POA: Diagnosis not present

## 2015-01-25 DIAGNOSIS — R700 Elevated erythrocyte sedimentation rate: Secondary | ICD-10-CM | POA: Diagnosis not present

## 2015-01-26 DIAGNOSIS — J441 Chronic obstructive pulmonary disease with (acute) exacerbation: Secondary | ICD-10-CM | POA: Diagnosis not present

## 2015-01-31 DIAGNOSIS — Z9981 Dependence on supplemental oxygen: Secondary | ICD-10-CM | POA: Diagnosis not present

## 2015-01-31 DIAGNOSIS — Z8701 Personal history of pneumonia (recurrent): Secondary | ICD-10-CM | POA: Diagnosis not present

## 2015-01-31 DIAGNOSIS — N184 Chronic kidney disease, stage 4 (severe): Secondary | ICD-10-CM | POA: Diagnosis not present

## 2015-01-31 DIAGNOSIS — Z8614 Personal history of Methicillin resistant Staphylococcus aureus infection: Secondary | ICD-10-CM | POA: Diagnosis not present

## 2015-01-31 DIAGNOSIS — I10 Essential (primary) hypertension: Secondary | ICD-10-CM | POA: Diagnosis not present

## 2015-01-31 DIAGNOSIS — I5030 Unspecified diastolic (congestive) heart failure: Secondary | ICD-10-CM | POA: Diagnosis not present

## 2015-01-31 DIAGNOSIS — L89312 Pressure ulcer of right buttock, stage 2: Secondary | ICD-10-CM | POA: Diagnosis not present

## 2015-01-31 DIAGNOSIS — M069 Rheumatoid arthritis, unspecified: Secondary | ICD-10-CM | POA: Diagnosis not present

## 2015-01-31 DIAGNOSIS — J441 Chronic obstructive pulmonary disease with (acute) exacerbation: Secondary | ICD-10-CM | POA: Diagnosis not present

## 2015-01-31 DIAGNOSIS — R609 Edema, unspecified: Secondary | ICD-10-CM | POA: Diagnosis not present

## 2015-02-02 DIAGNOSIS — Z853 Personal history of malignant neoplasm of breast: Secondary | ICD-10-CM | POA: Diagnosis not present

## 2015-02-02 DIAGNOSIS — R700 Elevated erythrocyte sedimentation rate: Secondary | ICD-10-CM | POA: Diagnosis not present

## 2015-02-02 DIAGNOSIS — Z79899 Other long term (current) drug therapy: Secondary | ICD-10-CM | POA: Diagnosis not present

## 2015-02-02 DIAGNOSIS — Z8614 Personal history of Methicillin resistant Staphylococcus aureus infection: Secondary | ICD-10-CM | POA: Diagnosis not present

## 2015-02-02 DIAGNOSIS — D631 Anemia in chronic kidney disease: Secondary | ICD-10-CM | POA: Diagnosis not present

## 2015-02-02 DIAGNOSIS — D509 Iron deficiency anemia, unspecified: Secondary | ICD-10-CM | POA: Diagnosis not present

## 2015-02-02 DIAGNOSIS — Z8542 Personal history of malignant neoplasm of other parts of uterus: Secondary | ICD-10-CM | POA: Diagnosis not present

## 2015-02-05 DIAGNOSIS — I10 Essential (primary) hypertension: Secondary | ICD-10-CM | POA: Diagnosis not present

## 2015-02-05 DIAGNOSIS — F1920 Other psychoactive substance dependence, uncomplicated: Secondary | ICD-10-CM | POA: Diagnosis not present

## 2015-02-05 DIAGNOSIS — Z9981 Dependence on supplemental oxygen: Secondary | ICD-10-CM | POA: Diagnosis not present

## 2015-02-05 DIAGNOSIS — M069 Rheumatoid arthritis, unspecified: Secondary | ICD-10-CM | POA: Diagnosis not present

## 2015-02-05 DIAGNOSIS — M158 Other polyosteoarthritis: Secondary | ICD-10-CM | POA: Diagnosis not present

## 2015-02-05 DIAGNOSIS — Z8614 Personal history of Methicillin resistant Staphylococcus aureus infection: Secondary | ICD-10-CM | POA: Diagnosis not present

## 2015-02-05 DIAGNOSIS — L89312 Pressure ulcer of right buttock, stage 2: Secondary | ICD-10-CM | POA: Diagnosis not present

## 2015-02-05 DIAGNOSIS — M62830 Muscle spasm of back: Secondary | ICD-10-CM | POA: Diagnosis not present

## 2015-02-05 DIAGNOSIS — I5030 Unspecified diastolic (congestive) heart failure: Secondary | ICD-10-CM | POA: Diagnosis not present

## 2015-02-05 DIAGNOSIS — M545 Low back pain: Secondary | ICD-10-CM | POA: Diagnosis not present

## 2015-02-05 DIAGNOSIS — J441 Chronic obstructive pulmonary disease with (acute) exacerbation: Secondary | ICD-10-CM | POA: Diagnosis not present

## 2015-02-05 DIAGNOSIS — Z8701 Personal history of pneumonia (recurrent): Secondary | ICD-10-CM | POA: Diagnosis not present

## 2015-02-06 DIAGNOSIS — L89312 Pressure ulcer of right buttock, stage 2: Secondary | ICD-10-CM | POA: Diagnosis not present

## 2015-02-06 DIAGNOSIS — I5030 Unspecified diastolic (congestive) heart failure: Secondary | ICD-10-CM | POA: Diagnosis not present

## 2015-02-06 DIAGNOSIS — Z8701 Personal history of pneumonia (recurrent): Secondary | ICD-10-CM | POA: Diagnosis not present

## 2015-02-06 DIAGNOSIS — I10 Essential (primary) hypertension: Secondary | ICD-10-CM | POA: Diagnosis not present

## 2015-02-06 DIAGNOSIS — M069 Rheumatoid arthritis, unspecified: Secondary | ICD-10-CM | POA: Diagnosis not present

## 2015-02-06 DIAGNOSIS — Z8614 Personal history of Methicillin resistant Staphylococcus aureus infection: Secondary | ICD-10-CM | POA: Diagnosis not present

## 2015-02-06 DIAGNOSIS — J441 Chronic obstructive pulmonary disease with (acute) exacerbation: Secondary | ICD-10-CM | POA: Diagnosis not present

## 2015-02-06 DIAGNOSIS — Z9981 Dependence on supplemental oxygen: Secondary | ICD-10-CM | POA: Diagnosis not present

## 2015-02-08 DIAGNOSIS — M069 Rheumatoid arthritis, unspecified: Secondary | ICD-10-CM | POA: Diagnosis not present

## 2015-02-08 DIAGNOSIS — J441 Chronic obstructive pulmonary disease with (acute) exacerbation: Secondary | ICD-10-CM | POA: Diagnosis not present

## 2015-02-08 DIAGNOSIS — Z9981 Dependence on supplemental oxygen: Secondary | ICD-10-CM | POA: Diagnosis not present

## 2015-02-08 DIAGNOSIS — Z8701 Personal history of pneumonia (recurrent): Secondary | ICD-10-CM | POA: Diagnosis not present

## 2015-02-08 DIAGNOSIS — I5030 Unspecified diastolic (congestive) heart failure: Secondary | ICD-10-CM | POA: Diagnosis not present

## 2015-02-08 DIAGNOSIS — L89312 Pressure ulcer of right buttock, stage 2: Secondary | ICD-10-CM | POA: Diagnosis not present

## 2015-02-08 DIAGNOSIS — Z8614 Personal history of Methicillin resistant Staphylococcus aureus infection: Secondary | ICD-10-CM | POA: Diagnosis not present

## 2015-02-08 DIAGNOSIS — I10 Essential (primary) hypertension: Secondary | ICD-10-CM | POA: Diagnosis not present

## 2015-02-09 DIAGNOSIS — M069 Rheumatoid arthritis, unspecified: Secondary | ICD-10-CM | POA: Diagnosis not present

## 2015-02-09 DIAGNOSIS — Z9981 Dependence on supplemental oxygen: Secondary | ICD-10-CM | POA: Diagnosis not present

## 2015-02-09 DIAGNOSIS — I5030 Unspecified diastolic (congestive) heart failure: Secondary | ICD-10-CM | POA: Diagnosis not present

## 2015-02-09 DIAGNOSIS — J441 Chronic obstructive pulmonary disease with (acute) exacerbation: Secondary | ICD-10-CM | POA: Diagnosis not present

## 2015-02-09 DIAGNOSIS — I10 Essential (primary) hypertension: Secondary | ICD-10-CM | POA: Diagnosis not present

## 2015-02-09 DIAGNOSIS — Z8701 Personal history of pneumonia (recurrent): Secondary | ICD-10-CM | POA: Diagnosis not present

## 2015-02-09 DIAGNOSIS — Z8614 Personal history of Methicillin resistant Staphylococcus aureus infection: Secondary | ICD-10-CM | POA: Diagnosis not present

## 2015-02-09 DIAGNOSIS — L89312 Pressure ulcer of right buttock, stage 2: Secondary | ICD-10-CM | POA: Diagnosis not present

## 2015-02-12 DIAGNOSIS — J9611 Chronic respiratory failure with hypoxia: Secondary | ICD-10-CM | POA: Diagnosis not present

## 2015-02-12 DIAGNOSIS — I10 Essential (primary) hypertension: Secondary | ICD-10-CM | POA: Diagnosis not present

## 2015-02-12 DIAGNOSIS — N184 Chronic kidney disease, stage 4 (severe): Secondary | ICD-10-CM | POA: Diagnosis not present

## 2015-02-12 DIAGNOSIS — I5030 Unspecified diastolic (congestive) heart failure: Secondary | ICD-10-CM | POA: Diagnosis not present

## 2015-02-12 DIAGNOSIS — Z8701 Personal history of pneumonia (recurrent): Secondary | ICD-10-CM | POA: Diagnosis not present

## 2015-02-12 DIAGNOSIS — M069 Rheumatoid arthritis, unspecified: Secondary | ICD-10-CM | POA: Diagnosis not present

## 2015-02-12 DIAGNOSIS — J449 Chronic obstructive pulmonary disease, unspecified: Secondary | ICD-10-CM | POA: Diagnosis not present

## 2015-02-12 DIAGNOSIS — Z9981 Dependence on supplemental oxygen: Secondary | ICD-10-CM | POA: Diagnosis not present

## 2015-02-12 DIAGNOSIS — Z8614 Personal history of Methicillin resistant Staphylococcus aureus infection: Secondary | ICD-10-CM | POA: Diagnosis not present

## 2015-02-12 DIAGNOSIS — L89312 Pressure ulcer of right buttock, stage 2: Secondary | ICD-10-CM | POA: Diagnosis not present

## 2015-02-12 DIAGNOSIS — J441 Chronic obstructive pulmonary disease with (acute) exacerbation: Secondary | ICD-10-CM | POA: Diagnosis not present

## 2015-02-12 DIAGNOSIS — N390 Urinary tract infection, site not specified: Secondary | ICD-10-CM | POA: Diagnosis not present

## 2015-02-13 DIAGNOSIS — I5030 Unspecified diastolic (congestive) heart failure: Secondary | ICD-10-CM | POA: Diagnosis not present

## 2015-02-13 DIAGNOSIS — Z8701 Personal history of pneumonia (recurrent): Secondary | ICD-10-CM | POA: Diagnosis not present

## 2015-02-13 DIAGNOSIS — Z8614 Personal history of Methicillin resistant Staphylococcus aureus infection: Secondary | ICD-10-CM | POA: Diagnosis not present

## 2015-02-13 DIAGNOSIS — I10 Essential (primary) hypertension: Secondary | ICD-10-CM | POA: Diagnosis not present

## 2015-02-13 DIAGNOSIS — J441 Chronic obstructive pulmonary disease with (acute) exacerbation: Secondary | ICD-10-CM | POA: Diagnosis not present

## 2015-02-13 DIAGNOSIS — L89312 Pressure ulcer of right buttock, stage 2: Secondary | ICD-10-CM | POA: Diagnosis not present

## 2015-02-13 DIAGNOSIS — M069 Rheumatoid arthritis, unspecified: Secondary | ICD-10-CM | POA: Diagnosis not present

## 2015-02-13 DIAGNOSIS — Z9981 Dependence on supplemental oxygen: Secondary | ICD-10-CM | POA: Diagnosis not present

## 2015-02-14 ENCOUNTER — Emergency Department: Payer: Self-pay | Admitting: Emergency Medicine

## 2015-02-14 DIAGNOSIS — I10 Essential (primary) hypertension: Secondary | ICD-10-CM | POA: Diagnosis not present

## 2015-02-14 DIAGNOSIS — R609 Edema, unspecified: Secondary | ICD-10-CM | POA: Diagnosis not present

## 2015-02-14 DIAGNOSIS — I509 Heart failure, unspecified: Secondary | ICD-10-CM | POA: Diagnosis not present

## 2015-02-14 DIAGNOSIS — R079 Chest pain, unspecified: Secondary | ICD-10-CM | POA: Diagnosis not present

## 2015-02-14 DIAGNOSIS — J9690 Respiratory failure, unspecified, unspecified whether with hypoxia or hypercapnia: Secondary | ICD-10-CM | POA: Diagnosis not present

## 2015-02-14 DIAGNOSIS — J811 Chronic pulmonary edema: Secondary | ICD-10-CM | POA: Diagnosis not present

## 2015-02-14 DIAGNOSIS — M7981 Nontraumatic hematoma of soft tissue: Secondary | ICD-10-CM | POA: Diagnosis not present

## 2015-02-14 DIAGNOSIS — Z515 Encounter for palliative care: Secondary | ICD-10-CM | POA: Diagnosis not present

## 2015-02-14 DIAGNOSIS — J810 Acute pulmonary edema: Secondary | ICD-10-CM | POA: Diagnosis not present

## 2015-02-14 DIAGNOSIS — R0689 Other abnormalities of breathing: Secondary | ICD-10-CM | POA: Diagnosis not present

## 2015-02-14 DIAGNOSIS — R0789 Other chest pain: Secondary | ICD-10-CM | POA: Diagnosis not present

## 2015-02-14 DIAGNOSIS — Z7951 Long term (current) use of inhaled steroids: Secondary | ICD-10-CM | POA: Diagnosis not present

## 2015-02-14 DIAGNOSIS — R531 Weakness: Secondary | ICD-10-CM | POA: Diagnosis not present

## 2015-02-14 DIAGNOSIS — Z79899 Other long term (current) drug therapy: Secondary | ICD-10-CM | POA: Diagnosis not present

## 2015-02-14 DIAGNOSIS — Z79891 Long term (current) use of opiate analgesic: Secondary | ICD-10-CM | POA: Diagnosis not present

## 2015-02-14 DIAGNOSIS — R0602 Shortness of breath: Secondary | ICD-10-CM | POA: Diagnosis not present

## 2015-02-23 ENCOUNTER — Ambulatory Visit
Admit: 2015-02-23 | Disposition: A | Discharge status: Home / Self Care | Payer: Self-pay | Attending: Hematology and Oncology | Admitting: Hematology and Oncology

## 2015-02-23 DEATH — deceased

## 2015-03-17 NOTE — H&P (Signed)
PATIENT NAME:  Monica Noble, Monica Noble MR#:  [id number] DATE OF BIRTH:  [birth year]  DATE OF ADMISSION:  11/19/2014  PRIMARY CARE PHYSICIAN: Dr. Beverely Risen  CHIEF COMPLAINT: Shortness of breath.  HISTORY OF PRESENT ILLNESS: This is an 81 year old female who presents to the ED with a complaint of progressively worsening shortness of breath over the last 3-4 weeks. She states that this shortness of breath occurs on exertion, that she has been progressively more limited in what she can do before she becomes short of breath, to the point now that even if she just gets up and walks to her bathroom she becomes dyspneic. She has been back and forth to outpatient physicians multiple times for this, has had chest x-rays in the outpatient setting, has been treated with 2 courses of Levaquin and none of this has improved her shortness of breath. She states that she has had chills off and on and she has had some nausea and vomiting. Her vomiting is associated with meals. She says she also has some dysphagia, feeling like her food gets stuck in her throat when she swallows. She endorses a cough recently, which started recently in the last several days. It is by and large a nonproductive cough. She came to the ED today because she felt that she was not improving on therapies given to her by physicians in the outpatient setting. She was recently admitted here about a month ago for congestive heart failure and was diuresed well and sent home with Lasix. She has known chronic kidney disease. On evaluation in the ED here, she was found to have a creatinine significantly more elevated than her baseline, up to 2.74 today. She is also found on CT chest to have bronchial obstruction of the left lower lobe superior segment bronchus with consolidation around this occluded bronchus. Radiologist comments that this raises concern for an endobronchial lesion, cannot exclude a neoplasm, differential also including mucous plugging and aspiration  with recommendation for short-term follow up CT or further characterization of bronchoscopy. At this point hospitalists were contacted for admission.   PAST MEDICAL HISTORY:  Rheumatoid arthritis, osteoarthritis, osteoporosis, chronic kidney disease, breast cancer diagnosed in 1973 and treated with radical bilateral mastectomy, uterine cancer diagnosed in 2000 treated with total hysterectomy and radiation therapy; both of these had complete remission.  CURRENT MEDICATIONS: Oxycodone 10 mg q. 4 hours, hydroxychloroquine 200 mg q. 12 hours, prednisone 5 mg daily, omeprazole, carvedilol 25 mg b.i.d., aspirin 81 mg daily, citalopram 20 mg.   PAST SURGICAL HISTORY: Total hysterectomy as stated above. Bilateral radical mastectomies as stated above. Bilateral total knee replacements. Open reduction internal fixation of the right leg femur fracture, 2 back surgeries and bilateral breast reconstruction surgery.   ALLERGIES: No known drug allergies.   FAMILY HISTORY: Includes CAD, stroke in both her mother and her father, breast cancer.   SOCIAL HISTORY: She lives at home with her son, however, son is absent most often during the day. She is a nonsmoker and a nondrinker and denies illicit drug use.   REVIEW OF SYSTEMS:  CONSTITUTIONAL: Denies measured fever, however, endorses intermittent chills. Denies weakness. Denies significant weight changes recently.  EYES: Denies blurred or double vision. Denies redness or inflammation.  ENT: Denies ear pain. Denies hearing loss. Denies discharge. Endorses difficulty swallowing.  RESPIRATORY: Endorses recent nonproductive cough. Denies wheeze or hemoptysis. Denies progressive dyspnea on exertion.  CARDIOVASCULAR: Denies chest pain, orthopnea, edema, palpitations.  GASTROINTESTINAL: Endorses nausea and vomiting associated with meals. Denies diarrhea. Denies  abdominal pain or hematemesis.  GENITOURINARY: Denies dysuria, hematuria or frequency. HEMATOLOGIC: Denies  easy bruising or bleeding.  SKIN: Denies acne, rash or lesions.  MUSCULOSKELETAL: Endorses chronic arthritis. Denies acute joint swelling. Denies gout. NEUROLOGIC: Denies numbness, weakness, headache, seizures.  PSYCHIATRIC: Denies anxiety, depression or insomnia.   PHYSICAL EXAMINATION:  VITAL SIGNS: Blood pressure 153/92, pulse 65, temperature 99.4, respirations 19, oxygen saturation is 99%.  GENERAL APPEARANCE: the patient appears calm, in no apparent distress, lying supine in bed, well nourished.  HEENT: Pupils equal, round, reactive to light. Extraocular movements intact. No scleral icterus. No difficulty hearing. Moist mucosal membranes.  NECK: The thyroid is not enlarged.  NECK: Supple with no masses, it is nontender and no cervical adenopathy palpated. No evidence of JVD.  RESPIRATORY: The patient has significant right bibasilar wheezing, as well as left basilar diminished breath sounds. Breathing is nonlabored. There is no increased work of breathing. The patient is not in respiratory distress.  CARDIOVASCULAR: Irregular rhythm. The patient is not tachycardic or bradycardic. There is no murmur, rub or gallop auscultated. The patient has good pedal pulses with no lower extremity edema.  ABDOMEN: Soft, nontender and nondistended, with good bowel sounds no hepatosplenomegaly.  MUSCULOSKELETAL: There is no clubbing, cyanosis or edema noted on examination.  SKIN: There is no rash or lesion or induration. Her skin is warm and dry. NEUROLOGIC: Her cranial nerves II-XII are grossly intact. Sensation is intact throughout. There is no dysarthria or aphasia. Her strength is equal bilaterally in all extremities.  PSYCHIATRIC: She is alert and oriented x 3. She is cooperative. She has good judgment. She does not demonstrate depressed mood or affect.   LABORATORY DATA: Pertinent laboratory findings include white blood count 6.8, hemoglobin 12.1, platelets 110,000. Sodium 141, potassium 4.1, chloride  100, bicarb 35, creatinine 2.74, BUN 32, glucose 96. Her troponin was 0.03.   RADIOGRAPHIC IMAGING: Pertinent radiographic imaging includes chest x-ray with persistent patchy densities in the left lower chest and a subsequent CT chest without contrast which showed occlusion of the left lower lobe superior segment bronchus with consolidation of soft tissue around the bronchus as stated in the HPI.   ASSESSMENT AND PLAN: This is an 81 year old female progressive dyspnea on exertion, here today with bronchial obstruction found on CT in the Emergency Department requiring further evaluation with bronchoscopy. 1.  Bronchial obstruction. The patient's vital signs are stable. Her breathing is comfortable at this time. We have placed her on broad antibiotic coverage for possible aspiration, but she will need further evaluation of this bronchial obstruction with bronchoscopy to elucidate a cause. I have ordered a pulmonary consult for the morning. Will also order aggressive chest physiotherapy for her. 2.  Acute on chronic kidney injury. She had a recent admission for heart failure and was sent out with daily Lasix. However, her creatinine is now significantly elevated from the prior baseline listed in her chart review at 1.5 to 1.7. Her creatinine is now 2.7. We will hold her Lasix here as she currently has no clinical evidence of heart failure and we will monitor her renal function for improvement.  3.  Rheumatoid arthritis. This is a stable chronic problem. She is on chronic prednisone and hydroxychloroquine as well as oxycodone for associated pain. We will continue these medications here at home doses.  4.  Hypertension. This is also a chronic problem, which is stable here. We will continue her carvedilol at home dose. 5.  Deep venous thrombosis prophylaxis with sequential compression devices.  CODE STATUS: This patient is full code.  Time Spent: 45 min   ____________________________ Candace Cruise. Anne Hahn,  MD dfw:TT D: 11/19/2014 17:10:32 ET T: 11/19/2014 17:40:30 ET JOB#: [id number]  cc: Candace Cruise. Anne Hahn, MD, <Dictator> Kashira Behunin [address] MD ELECTRONICALLY SIGNED 11/19/2014 18:46

## 2015-03-17 NOTE — Consult Note (Signed)
PATIENT NAME:  Monica Noble, SPINNER MR#:  [id number] DATE OF BIRTH:  1930-11-11  DATE OF CONSULTATION:  11/21/2014  REFERRING PHYSICIAN: Dr. Allena Katz  CONSULTING PHYSICIAN:  Lutricia Feil, MD and Ranae Plumber. Arvilla Market, ANP (Adult Nurse Practitioner)  REASON FOR CONSULTATION: Dysphagia.   HISTORY OF PRESENT ILLNESS: This 81 year old patient presented to the Emergency Room with progressive shortness of breath over the last month. She is also having increasing difficulty swallowing food, with food getting hung up in the throat. The patient has end-stage chronic obstructive pulmonary disease on chronic oxygen therapy. She has had bilateral wheezing, chronic cough, chest CT revealed possible endobronchial obstruction, mucus versus mass. She does have a personal history of breast cancer and uterine cancer. She has had a 100 pounds weight loss over 5 years. The patient and her daughter report that she has had continued problems swallowing and eating.  GI has been consulted regarding dysphagia.   The patient did undergo a modified barium swallow study yesterday for mild to moderate oral pharyngeal dysphagia.  The patient demonstrated mild swallow delay, which resulted in minimal laryngeal penetration of thin liquids intermittently. All penetrator material cleared airway entrance during completion of swallow. Small sips aided. There was no aspiration observed. No significant pharyngeal residue was noted.  During the scan of the esophageal phase, she did have poor clearing of the esophagus with bolus stasis to the mid esophagus.    The patient is on a pureed diet with thin liquids, small bites, sips, eat slowly, sit fully upright for at least 60 minutes post meal, no straws. The patient's daughter is assisting her with eating and the patient has poor appetite.  She reports her throat is very sore and the inside of cheek hurts, especially on the left. She is receiving antibiotics for possible pulmonary infection. She denies history  of oral thrush. She denies history of previous EGD.    The patient admits to difficulty swallowing over the last 1-1/2 to 2 years. She had a barium swallow study at that time frame that her daughter reports was okay.  She has had very limited diet at home because of her overall health, including renal failure. She does not tolerate dairy as that causes a lot of mucus and makes her pulmonary status worse.    The patient has been seen by pulmonology and is deemed not to be an IV sedation candidate for bronchoscopy at this time.  The pulmonary recommendation is to continue to treat her with antibiotics and follow her clinical course.   PAST MEDICAL HISTORY:  1.  Rheumatoid arthritis.  2.  Osteoarthritis.  3.  Osteoporosis.  4.  Spinal stenosis, wheelchair dependent.  5.  Chronic kidney disease.  6.  Breast cancer diagnosed in 1973 treated with prior radical bilateral mastectomy.  7.  Uterine cancer diagnosed in 2000 treated with total hysterectomy and radiation therapy, both of these had complete remission.  8.  Chronic kidney disease followed by nephrology. 9.  Coronary artery disease with history of myocardial infarction about 5 years ago with cardiac stent.   PAST SURGICAL HISTORY:  1.  Two back surgeries.  2.  Neck surgery.  3.  Bilateral total knee replacements.  4.  Cardiac stent.  5.  Open reduction internal fixation right leg femur fracture.  6.  Bilateral breast reconstruction surgery.   HOME MEDICATIONS:  1.  Oxycodone 10 mg every 4 hours.  2.  Hydroxychloroquine 200 mg every 12 hours.  3.  Prednisone 5 mg daily.  4.  Omeprazole.  6.  Carvedilol 25 mg twice daily.  7.  Aspirin 81 mg daily.  8.  Citalopram 20 mg daily.  9.  Furosemide dose approximately 20 mg daily.  10. Flonase.  11. Albuterol inhaler as needed.     ALLERGIES: NKDA.   FAMILY HISTORY: Negative for colon cancer.   SOCIAL HISTORY: The patient lives at home, her son is in and out of the house. Denies  tobacco or alcohol, positive secondhand smoke.   REVIEW OF SYSTEMS:  The patient says overall, she does not feel well.  She reports weakness, fatigue, daughter reports decreased functional status. The patient goes from bed to chair. She has a small refrigerator in her bathroom as she cannot make it in her wheelchair all the way through the house to the litchen easily.  She has had decreasing appetite and weight loss as noted.  HEENT: Denies blurred vision, ear pain.  RESPIRATORY: Nonproductive cough, progressive dyspnea on exertion. CARDIAC:  Denies chest pain, pressure, heaviness, tightness.  GASTROINTESTINAL: Occasional nausea, and she says she vomits up food that will not go down. Food gets caught in the throat in the esophagus area. Liquids also hang. This has been progressive over the last 1-1/2 to 2 years, but recently, much worse. She reports a sore throat and reports discomfort in her left cheek area.  She feels like there are sores present. She had a bowel movement today and denies any in the lower GI complaints. Remaining 10 systems otherwise negative.   PHYSICAL EXAMINATION:  VITAL SIGNS: Temperature 98.5, pulse 51, respirations 20, blood pressure 135/63, pulse oximetry 93% on 2 liters.  GENERAL: Disheveled, elderly Caucasian female, looks chronically ill and very weak.  HEENT: Head is normocephalic. Conjunctivae pink. Sclerae anicteric. Oral mucosa is examined closely with a flashlight. There is yogurt in the crevices of her buccal areas in the mouth, but I do not see apthous ulcers, blisters, reddened areas. There is no obvious oral thrush on the tongue, although there is yogurt present, this scrapes off.  Posterior pharynx not examined as the patient has a very small mouth opening and is not able to open wide enough for me to look at the posterior pharynx.   NECK: Supple. Trachea midline.  CARDIAC: S1, S2 without murmur or gallop.  LUNGS: Clear to auscultation essentially anteriorly but  when she rolls laterally, she does have a few scattered wheezes, no obvious crackles or rhonchi. Respirations are nonlabored.  ABDOMEN: Soft, she has a deformity from her breast reconstruction were tissue was taken from her abdomen. There is minimal to no abdominal tenderness. Benign exam.  RECTAL: Deferred.  EXTREMITIES: Without edema, cyanosis, or clubbing.  MUSCULOSKELETAL: Positive arthritis changes in her fingers.  NEUROLOGIC: The patient is drowsy, answers some questions vaguely, daughter fills in most of the history. The patient sleeps.  She does attempt to roll from side to side, very weak. Extremity movement x 4 noted.   LABORATORY, DIAGNOSTIC, AND RADIOLOGICAL DATA:  Admission labs with BUN 32, creatinine 2.74. Troponin less than 0.03. Hemoglobin 12.1. WBC 6.8.  Blood culture negative.   Repeat serial labs up to 11/21/2014, with BUN 28, creatinine 1.97, anion gap 4, estimated GFR is 18% to 26%. Calcium 8.2 to 7.0, hemoglobin today 9.4, platelet count 90,000.   RADIOLOGY: Chest CT without contrast, positive for occlusion of the left lower lobe superior segment bronchus. Consolidation versus soft tissue around this occluded bronchus. Findings raise concern for endobronchial lesion and cannot exclude lung neoplasm. There is  a left renal cysts indeterminate.  There is a hiatal hernia and coronary artery calcifications.  Chest x-ray, PA and lateral, dated 11/19/2014 with persistent patchy density in the left lower chest. There is retrocardiac density compatible with hiatal hernia.   IMPRESSION: This patient presents with solid food and liquid dysphagia to the level of the clavicle. This occurs with swallowing.  She does have to occasionally vomit this up. This is kept her from eating and drinking normally and she has had progressive weight loss according to the family. Modified barium swallow showed oropharyngeal dysphagia as well esophageal dysphagia. There is question of liquids pooling in the  mid esophagus. There is a question of delay in emptying of the contrast down the mid esophagus. The patient reports a sore throat, mouth pain and with setting of antibiotics and the acute on chronic difficulty swallowing, we must consider the addition of esophageal candidiasis. The patient is having trouble tolerating her pudding and yogurt type foods.  She is not an upper endoscopy candidate at this time due to her overall weakness and her pulmonary status.   PLAN:  1.  Begin nystatin swish and swallow. She has major drug interactions with Diflucan secondary to oxycodone and citalopram.   We will see how she does with nystatin.  If her mouth becomes less sore, we can consider 2 peppermint Altoids to chew before meals, as this may help with esophageal spasm.  2.  It is possible to consider a barium swallow when patient becomes stronger. If she is not deemed to be a candidate for EGD.  3.  Continue with proton pump inhibitor.  4.  We may consider dietary consults for suggestions for increased calories with respect to her worsening renal status. Family has been concerned about this. Also, the patient tends to produce more pulmonary congestion when she takes in dairy. The family reports the patient comes to [place] with significant nutrition deficits. Her serum albumin can be checked in the a.m. with routine laboratory studies.   This case was discussed with Dr. Bluford Kaufmann in collaboration of care.   Thank you for the consultation.  These services provided by Cala Bradford A. Arvilla Market, MS, APRN, BC, ANP (Adult Nurse Practitioner) under collaborative agreement with Lutricia Feil, M.D.     ____________________________ Ranae Plumber. Arvilla Market, ANP (Adult Nurse Practitioner) kam:DT D: 11/21/2014 16:03:49 ET T: 11/21/2014 16:47:19 ET JOB#: [id number]  cc: Cala Bradford A. Arvilla Market, ANP (Adult Nurse Practitioner), <Dictator> Ranae Plumber Suzette Battiest, MSN, ANP-BC Adult Nurse Practitioner ELECTRONICALLY SIGNED 11/22/2014 17:35

## 2015-03-17 NOTE — Consult Note (Signed)
Pt seen and examined. Please see K. Arvilla Market' notes. Pt with oropharyngeal dysphagia as well as esophageal dysphagia. Some delay in emptying of contrast down the esophagus. Unfortunately, patient not in any shape right now to undergo EGD. Need to treat COPD and possible pneumonia 1st. Can consider UGI later to evaluate rest of esophagus. If patient has abnormal peristalsis, we are limited in what we can offer for patient other than alternative nutritional support. If stricture is present on UGI series, then call us back. We can plan EGD with possible dilation once respiratory status stabilizes. For now, follow speech path recommendations. Thanks.  Electronic Signatures: Lutricia Feil (MD)  (Signed on 29-Dec-15 15:30)  Authored  Last Updated: 29-Dec-15 15:30 by Lutricia Feil (MD)

## 2015-03-21 NOTE — Consult Note (Signed)
Chief Complaint:  Subjective/Chief Complaint Asked to see patient in follow up of previous GI consult.  Patietn admitted with bronchial obstruction and shortness of breath,. Patietn with chronic dysphagia for at least 1-5 years, worsening recently.   Evaluation last week included MBSW and BSw.  Modified barium swallow showed oropharyngeal dysphagia as well esophageal dysphagia.  The Barium swallow shows a marked esophageal dysmotility c/w presbyesophagus adn a moderate hiatal hernia.   Patietn relates her symptoms as both liquids adn solids getting stuck in the low cervical area.  This would be consistant with These findings.  The Full barium study was a limited exam due to difficulty drinking the barium.   VITAL SIGNS/ANCILLARY NOTES: **Vital Signs.:   01-Jan-16 05:03  Vital Signs Type Routine  Temperature Temperature (F) 98.2  Celsius 36.7  Pulse Pulse 57  Respirations Respirations 20  Systolic BP Systolic BP 132  Diastolic BP (mmHg) Diastolic BP (mmHg) 68  Mean BP 89  Pulse Ox % Pulse Ox % 97  Pulse Ox Activity Level  At rest  Oxygen Delivery 2L    13:41  Vital Signs Type Routine  Temperature Temperature (F) 98.4  Celsius 36.8  Temperature Source oral  Pulse Pulse 58  Respirations Respirations 20  Systolic BP Systolic BP 131  Diastolic BP (mmHg) Diastolic BP (mmHg) 59  Mean BP 83  Pulse Ox % Pulse Ox % 95  Pulse Ox Activity Level  At rest  Oxygen Delivery 2L   Brief Assessment:  Cardiac Regular   Respiratory wheezing  rhonchi  marked   Gastrointestinal details normal Soft  Nontender  Nondistended  Bowel sounds normal   Radiology Results: XRay:    31-Dec-15 13:29, Barium Swallow Diagnostic  Barium Swallow Diagnostic   REASON FOR EXAM:    difficult with swolling  COMMENTS:       PROCEDURE: FL  - FL BARIUM SWALLOW  - Nov 23 2014  1:29PM     CLINICAL DATA:  Difficulty swallowing.    EXAM:  ESOPHOGRAM/BARIUM SWALLOW    TECHNIQUE:  Single contrast examination was  performed using  thin barium    FLUOROSCOPY TIME:  0 min 42 seconds.  COMPARISON:  CT 11/20/2014.    FINDINGS:  Very limited exam as patient had difficulty drinking barium. The  esophagus is patent. A large sliding hiatal hernia is noted. A mass  within the hiatal hernia cannot be excluded. Endoscopic evaluation  should be considered.     IMPRESSION:  Very limited exam. Large hiatal hernia. A mass lesion in the hiatal  hernia cannot be excluded. Endoscopic evaluation may prove useful  for further evaluation.      Electronically Signed    By: Maisie Fus  Register    On: 11/23/2014 13:55         Verified By: Gwynn Burly, M.D., MD  CT:    27-Dec-15 14:36, CT Chest Without Contrast  CT Chest Without Contrast   REASON FOR EXAM:    persistent densities in lungs  COMMENTS:       PROCEDURE: CT  - CT CHEST WITHOUT CONTRAST  - Nov 19 2014  2:36PM     CLINICAL DATA:  Difficulty breathing with cough for 3 weeks.    EXAM:  CT CHEST WITHOUT CONTRAST    TECHNIQUE:  Multidetector CT imaging of the chest was performed following the  standard protocol without IV contrast..    COMPARISON:  Chest CT 12/19/2009 and chest radiograph 11/19/2014  FINDINGS:  Again noted is a low-density  right thyroid nodule roughly measured  2.1 cm and minimally changed since 2011. The ascending thoracic  aorta is heavily calcified and there are coronary artery  calcifications. Again noted is a moderate to large-sized hiatal  hernia. Again noted is a exophytic lesion involving the leftkidney  upper pole. The central aspect of the renal lesion is hypodense and  there appears to be peripheral hyperdense areas. Lesion measures  roughly 2.8 cm and previously measured 2.6 cm. There is also a  hyperdense structure along the anterior left kidney upper pole and a  larger hypodense structure along the posterior left kidney. Mild  subcutaneous edema in the upper axillary regions bilaterally. There  is no  significant chest lymphadenopathy. Heart size is mildly  enlarged but unchanged.  The trachea and mainstem bronchi are patent. However, there is  occlusion of the bronchus extending to the left lower lobe superior  segment. There is also new central consolidation in the superior  segment of the left upper lobe and there may be additional small  filling defects in the left lower lobe airways. Focal volume loss at  the base of the lingula. Right lung is clear without focal disease.  Degenerative changes in the right shoulder. Surgical in the lower  cervical spine with mild anterolisthesis at C7-T1. There is also  mild anterolisthesis at T1-T2     IMPRESSION:  There is occlusion of the left lower lobe superior segment bronchus.  There is consolidation or soft tissue around this occluded bronchus.  This area is difficult to evaluate without intravenous contrast.  Findings raise concern for an endobronchial lesion and cannot  exclude a lung neoplasm. Differential diagnosis also includes mucus  plugging and aspiration. Recommend either short-term follow-up CT to  ensure resolution or further characterization with bronchoscopy.    Evidence for left renal cysts. At least 2 of these presumed cysts  are indeterminate. These are incompletely evaluated on this  examination. If further characterization of these renal lesions is  desired, this could be more definitively evaluated with MRI.    Hiatal hernia.    Coronary artery calcifications.      Electronically Signed    By: Richarda Overlie M.D.    On: 11/19/2014 15:01         Verified By: Arn Medal, M.D.,   Assessment/Plan:  Assessment/Plan:  Assessment 1) dysphagia-multifactorial-including body habitus with kyphosis, severe presbyesophagus.  Actual stricure not noted on barium study, though it was a limited exam.   2) post obstructiv pna-slow improvement   Plan 1) on exam today I fell she is still high risk for sedated proceedure,  still with frequent movement induced shortness of breath and need for frequent breathing tx.   I agree that EGD should be done as clinically feasible.  Will reassess on monday.   Electronic Signatures: Barnetta Chapel (MD)  (Signed 01-Jan-16 19:36)  Authored: Chief Complaint, VITAL SIGNS/ANCILLARY NOTES, Brief Assessment, Radiology Results, Assessment/Plan   Last Updated: 01-Jan-16 19:36 by Barnetta Chapel (MD)

## 2015-03-25 NOTE — Consult Note (Signed)
Chief Complaint:  Subjective/Chief Complaint Asked to see patient again for possible EGD. Overall breathing better. Clinically better but still on O2.   VITAL SIGNS/ANCILLARY NOTES: **Vital Signs.:   04-Jan-16 05:16  Vital Signs Type Routine  Temperature Temperature (F) 98  Celsius 36.6  Temperature Source oral  Pulse Pulse 57  Respirations Respirations 20  Systolic BP Systolic BP 527  Diastolic BP (mmHg) Diastolic BP (mmHg) 70  Mean BP 98  Pulse Ox % Pulse Ox % 98  Pulse Ox Activity Level  At rest  Oxygen Delivery 2L   Brief Assessment:  GEN no acute distress   Cardiac Regular   Respiratory rhonchi   Lab Results: Routine Chem:  03-Jan-16 04:27   Creatinine (comp)  1.67  eGFR (African American)  38  eGFR (Non-African American)  31 (eGFR values <26m/min/1.73 m2 may be an indication of chronic kidney disease (CKD). Calculated eGFR, using the MRDR Study equation, is useful in  patients with stable renal function. The eGFR calculation will not be reliable in acutely ill patients when serum creatinine is changing rapidly. It is not useful in patients on dialysis. The eGFR calculation may not be applicable to patients at the low and high extremes of body sizes, pregnant women, and vegetarians.)   Assessment/Plan:  Assessment/Plan:  Assessment Dysphagia. Likely has both oropharyngeal dysphagia and esophageal dysmotility.   Plan Please see my notes from last week. EGD with dilation will only benefit if there is esophageal stricture. Still not sure patient can tolerate EGD yet. Await pulmonary to see if and when they will schedule bronchoscopy. I will order UGI series 1st to evaluate rest of esophagus. If UGI series show esophageal stricture, only then I will consider EGD later this week, Thurs?   Electronic Signatures: OVerdie Shire(MD)  (Signed 04-Jan-16 10:08)  Authored: Chief Complaint, VITAL SIGNS/ANCILLARY NOTES, Brief Assessment, Lab Results, Assessment/Plan   Last  Updated: 04-Jan-16 10:08 by OVerdie Shire(MD)

## 2015-03-25 NOTE — Discharge Summary (Signed)
PATIENT NAME:  Monica Noble, Monica Noble MR#:  [id number] DATE OF BIRTH:  August 18, 1930  DATE OF ADMISSION:  11/19/2014 DATE OF DISCHARGE:  11/28/2014  ADMITTING DIAGNOSES: Shortness of breath, acute respiratory failure.   DISCHARGE DIAGNOSES: 1.  Respiratory failure due to methicillin-resistant Staphylococcus aureus  pneumonia, as well as possible mucus plugging, as well as acute bronchospasm.  2.  Acute on chronic kidney injury due to dehydration, now resolved.  3.  Dysphagia with esophagus with large hiatal hernia and a tortuous esophagus seen by GI, and they did not feel that she would be a good candidate for an EGD, and I would not change her management.  4.  Rheumatoid arthritis.  5.  Hypertension.  6.  Osteoporosis.  7.  History of breast cancer treated with radical bilateral mastectomy.  8.  Uterine cancer treated with total hysterectomy and radiation therapy.  9.  Bilateral total knee replacement.  10.  Open reduction internal fixation of the right leg femur fracture.  11.  Status post 2 back surgeries. 12. Status post bilateral breast reconstruction surgeries.   PERTINENT LABORATORY EVALUATIONS: Admitting glucose 96, BUN 32, creatinine 2.74, sodium 141, potassium 4.1, chloride 100, CO2 of 35, calcium 8.2. Most recent creatinine on January 05th is 1.52 LFTs showed albumin of 1.8, alkaline phosphatase 38, WBC 6.8, hemoglobin 12.1, platelet count was 110. Blood cultures x 2: No growth. Sputum cultures showed MRSA. CT scan of the chest without contrast showed occlusion of the left lower lobe superior segment bronchus. There is consolidation or soft tissue around this occluded bronchus area that are difficult to evaluate. Differential diagnoses include mucus plugging and aspiration. Upper GI showed limited exam. Again noted large lateral hernia. No evidence of distal esophageal or hiatal hernia mass or obstruction lesion.   CONSULTANTS:  Dr. Belia Heman of  pulmonary. GI, Dr. Bluford Kaufmann.   HOSPITAL COURSE: Please  refer to H and P done by the admitting physician. The patient is an 81 year old white female who presented with respiratory difficulties over the past 3-4 weeks. Worsening. The patient was seen in the ED and had a CT of the chest, which showed the above findings. There was some concern that she may have mucous plugging, having aspiration. The patient also was complaining of dysphagia with food getting stuck; therefore, she underwent a GI evaluation with an upper GI series. The patient has a significant presbyesophagus esophagus with a large lateral hernia. GI did not feel that there was a stricture or mass. They felt that she was too high risk for an endoscopy. She was also seen by pulmonary, who recommended repeating CT. Her sputum culture did grow MRSA. She has been treated with vancomycin. The patient is slow to improve. Pulmonary recommended outpatient CT scan of the chest. She is currently stable for discharge to a nursing home.   DISCHARGE MEDICATIONS: Hydrochloroquine 20 one tab p.o. b.i.d., Lasix 20 daily, as needed for swelling, carvedilol 25 at bedtime and when resolved, 40 one tab p.o. b.i.d., prednisone 5 daily, this is a chronic medication and is to be continued for rheumatoid arthritis. Aspirin 81 mg 1 tab p.o. daily, fluticasone 50 mcg 1 spray b.i.d., ProAir 90 mcg 1 puff b.i.d. as needed, oxycodone 10 mg q. 4 hours, acetaminophen 325 q. 4 p.r.n., magnesium hydroxide 30 mL b.i.d. as needed, aluminum hydroxide/magnesium hydroxide/simethicone 30 mL q. 6 p.r.n., nystatin 5 mL of every 6 hours 5 days, albuterol/ipratropium 3 mL 4 times a day, Advair 250/50 one puff b.i.d., Spiriva 18 mcg daily, guaifenesin 600  one tab p.o. b.i.d., senna 1 tab p.o. b.i.d. as needed, docusate 200 b.i.d., ocular lubricant 1 drop to each affected eye b.i.d., folic acid 1 mg daily, (Dictation Anomaly)< 240 mg 1 tab p.o. b.i.d., linezolid 600 one tab p.o. q. 12.   DISCHARGE DIET: Regular consistency, thin liquids,  mechanical soft foods, easy to masticate, foods of choice, medications in puree for safer, easier swallowing, aspiration precautions, no straws.   FOLLOWUP APPOINTMENTS: Follow with MD at the nursing facility in 1-2 weeks and Dr. Beverely Risen in 4-6 weeks.   The patient is to get a CT scan of the chest with IV contrast in 6 weeks, results to Dr. Beverely Risen.    TIME SPENT ON THIS PATIENT'S DISCHARGE: 35 minutes.   ____________________________ Lacie Scotts Allena Katz, MD shp:mw D: 11/28/2014 11:46:27 ET T: 11/28/2014 12:10:37 ET JOB#: [id number]  cc: Magdalynn Davilla H. Allena Katz, MD, <Dictator> Charise Carwin MD ELECTRONICALLY SIGNED 12/02/2014 9:25

## 2015-03-25 NOTE — Discharge Summary (Signed)
PATIENT NAME:  Monica Noble, Monica Noble MR#:  [id number] DATE OF BIRTH:  December 01, 1929  DATE OF ADMISSION:  12/04/2014 DATE OF DISCHARGE:  12/08/2014  DISCHARGE DIAGNOSES:  1.  Methicillin-resistant Staphylococcus aureus pneumonia.  2.  Acute diastolic heart failure new onset.   3.  Acute on chronic kidney disease, stage II, close to baseline.  4.  Pleural effusion, status post ultrasound-guided thoracentesis.   5.  Elevated troponin likely due to supply/demand ischemia.   SECONDARY DIAGNOSES:   1.  Rheumatoid arthritis, osteoporosis.  2.  Chronic kidney disease.  3.  Breast cancer diagnosed in 1973 4.  Uterine cancer diagnosed in 2000, treated with total hysterectomy and radiation.  5.  Recent MRSA pneumonia in December 2015.   CONSULTATION: Pulmonary, Herbon E. Meredeth Ide, MD    PROCEDURES AND RADIOLOGY: Ultrasound-guided thoracentesis on January 14 on the left side with about 150 mL of clear yellow serous fluid removal. No immediate complication.   Chest x-ray on January 11 showed:   1.  New moderate bilateral pleural effusion and basilar airspace disease, could be atelectasis or pneumonia.  2.  Cardiomegaly.   Chest x-ray post thoracentesis on January 14 showed no evidence of pneumothorax following thoracentesis.   A 2-D echocardiogram on January 11 showed:   1.  LVEF of 55 to 60%.  2.  Impaired relaxation pattern of LV diastolic filling.  3.  Moderate concentric LVH.  4.  Moderately elevated pulmonary artery systolic pressure.   MAJOR LABORATORY PANEL:  1.  Blood culture x 1 was negative on January 11.  2.  Sputum culture grew moderate growth of yeast.  3.  Stool for Clostridium difficile was negative.  4.  Pleural fluid was negative for any organisms.   HISTORY AND SHORT HOSPITAL COURSE: The patient is an 81 year old female with above-mentioned medical problems was admitted for cough and shortness of breath, was found to have MRSA pneumonia. Please see Dr. Larose Hires dictated history  and physical for further details. The patient was started on broad-spectrum antibiotics. Pulmonary consultation was obtained with Dr. Ned Clines who recommended decubitus x-ray to evaluate for clearing of effusion, was found to have good enough size of effusion on the left for which thoracentesis was performed under ultrasound guidance with 150 mL of fluid removal. The patient was feeling much better status post thoracentesis. Palliative care consultation was obtained with Dr. Harriett Sine Phifer who had a discussion with patient's family along with patient who were in agreement to have palliative care follow at rehab facility. The patient was feeling a whole lot better by January 15 when discharged to rehab in fair condition.   PHYSICAL EXAMINATION:  VITAL SIGNS: On the date of discharge, her vital signs were as follows: Temperature 98.6, heart rate 69 per minute, respirations 20 per minute, blood pressure 136/79. She was saturating 95% on 3 liters oxygen via nasal cannula.  Pertinent physical examination on the date of discharge:  CARDIOVASCULAR: S1, S2 normal. No murmur, rubs, gallops.  LUNGS: Clear to auscultation bilaterally. No wheezing, rales, rhonchi, or crepitation.  ABDOMEN: Soft, benign.  NEUROLOGIC: Nonfocal examination.   All other physical examination remained at baseline.   DISCHARGE MEDICATIONS:   Medication Instructions  hydroxychloroquine 200 mg tablet  1 tab(s) orally 2 times a day   furosemide 20 mg tablet  1 tab(s) orally once a day, As Needed for extra fluid.    omeprazole 40 mg oral delayed release capsule  1 cap(s) orally 2 times a day   prednisone 5 mg oral  tablet  1 tab(s) orally once a day (in the morning)   aspirin 81 mg oral tablet  1 tab(s) orally once a day (at bedtime)   fluticasone nasal 50 mcg/inh nasal spray  1 spray(s) nasal 2 times a day, As Needed for nasal congestion.    proair hfa cfc free 90 mcg/inh inhalation aerosol  1 puff(s) inhaled 2 times a day, As  Needed - for Wheezing, for Shortness of Breath    acetaminophen 325 mg oral tablet  1 tab(s) orally every 4 hours, As needed, mild pain (1-3/10) or temp. greater than 100.4.   oxycodone 10 mg oral tablet  1 tab(s) orally every 4 hours, As Needed - for Pain   magnesium hydroxide 8% oral suspension  30 milliliter(s) orally 2 times a day, As Needed - for Constipation   aluminum hydroxide/magnesium hydroxide/simethicone 200 mg-200 mg-20 mg/5 ml oral suspension  30 milliliter(s) orally every 6 hours, As Needed - for Indigestion, Heartburn   nystatin 100,000 units/ml oral suspension  5 milliliter(s) orally every 6 hours   albuterol-ipratropium 2.5 mg-0.5 mg/3 ml inhalation solution  3 milliliter(s) inhaled 4 times a day   fluticasone-salmeterol 250 mcg-50 mcg inhalation powder  1 puff(s) inhaled 2 times a day   guaifenesin 600 mg oral tablet, extended release  1 tab(s) orally 2 times a day   senna  1 tab(s) orally 2 times a day, As Needed - for Constipation   docusate sodium 100 mg oral capsule  2 cap(s) orally 2 times a day   ocular lubricant  1 drop(s) to each affected eye 2 times a day   folic acid 1 mg oral tablet  1 tab(s) orally once a day   spiriva 18 mcg inhalation capsule  1 cap(s) inhaled once a day   linezolid 600 mg oral tablet  1 tab(s) orally every 12 hours x 10 days   carvedilol 25 mg oral tablet  1 tab(s) orally once a day   benzocaine-menthol topical  1 lozenge orally every hour x 7 days, As Needed, cough, sore throat , As needed, cough, sore throat   diflucan 100 mg oral tablet  1 tab(s) orally once a day x 10 days    DISCHARGE DIET: Regular.   DISCHARGE ACTIVITY: As tolerated.   DISCHARGE INSTRUCTIONS AND FOLLOWUP:  The patient was instructed to follow up with her primary care physician, Dr. Beverely Risen, in 1 to 2 weeks. She will need follow up with Dr. Ned Clines from pulmonary in 2 to 4 weeks. She will get physical therapy evaluation and management while at the facility. She  will also get palliative care evaluation while at the facility and she will be followed by them to consider need for hospice in the future if she deteriorates. Please also consider using 2 to 3 liters oxygen as needed while at the facility depending on her requirement.   TOTAL TIME DISCHARGING THIS PATIENT: 45 minutes.   She remains at very high risk for readmission.    ____________________________ Ellamae Sia. Sherryll Burger, MD vss:AT D: 12/08/2014 23:03:19 ET T: 12/08/2014 23:42:36 ET JOB#: [id number]  cc: Garland Hincapie S. Sherryll Burger, MD, <Dictator> Lyndon Code, MD Herbon E. Meredeth Ide, MD Ned Grace, MD Ellamae Sia [facility] MD ELECTRONICALLY SIGNED 12/14/2014 10:11

## 2015-03-25 NOTE — H&P (Signed)
PATIENT NAME:  Monica Noble, Monica Noble MR#:  [id number] DATE OF BIRTH:  10-24-1930  DATE OF ADMISSION:  12/04/2014  PRIMARY CARE PHYSICIAN: Lyndon Code, MD   REFERRING EMERGENCY ROOM PHYSICIAN:  Emily Filbert, MD   CHIEF COMPLAINT: Cough and shortness of breath.   HISTORY OF PRESENT ILLNESS: This is an 81 year old female who has history of rheumatoid arthritis, osteoarthritis, chronic kidney disease, breast cancer, uterine cancer. She was admitted recently to our hospital 2 weeks ago, stayed for the whole weekend, diagnosed with  MRSA pneumonia and discharged to a nursing home with oral linezolid. As per the patient she feels that she was discharged too early and she was not ready for that. She said that after she was discharged, the same day at nursing home, she started feeling short of breath. She had gradual worsening of cough over last week, over there, and some tightness in the chest also. She started coughing more and more and her sputum is getting more and more yellowish and so finally she requested them to send her back to hospital and came over here. In work-up in the ER on chest x-ray, there are some new infiltrates compared to previous x-ray and so given to hospitalist team as she has history of MRSA pneumonia last week. Further questioning, she denies any fever.   REVIEW OF SYSTEMS:   CONSTITUTIONAL: Negative for fever, fatigue, weakness, pain, or weight loss.  EYES: No blurring of vision, discharge or redness.  EARS AND NOSE AND THROAT: No tinnitus, ear pain or hearing loss.  RESPIRATORY: The patient has some cough and shortness of breath. No wheezing or hemoptysis.  CARDIOVASCULAR: No chest pain, orthopnea, edema, arrhythmia, palpitations.  GASTROINTESTINAL: No nausea, vomiting, diarrhea, abdominal pain.  GENITOURINARY: No dysuria, hematuria, or increased frequency.  ENDOCRINE: No heat or cold intolerance.  SKIN: No acne, rashes, or lesions.  MUSCULOSKELETAL: No pain or swelling in  the joints.  NEUROLOGICAL: No numbness, weakness, tremor, or vertigo.  PSYCHIATRIC: No anxiety, insomnia, bipolar disorder.   PAST MEDICAL HISTORY:   1.  Rheumatoid arthritis, osteoporosis.  2.  Chronic kidney disease.  3.  Breast cancer diagnosed in 1973, treated with bilateral mastectomy.  4.  Uterine cancer diagnosed in 2000, treated with total hysterectomy and radiation.  5.  Recent MRSA pneumonia in December 2015.   PAST SURGICAL HISTORY:  1.  Total hysterectomy.  2.  Bilateral mastectomy.  3.  Bilateral total knee replacement.  4.  Open reduction and internal fixation of right leg femur.  5.  Two back surgeries.  6.  Bilateral breast reconstruction surgery.   FAMILY HISTORY:  1.  Coronary artery disease.  2.  Stroke in both mother and father.  3.  Breast cancer.   SOCIAL HISTORY: Recently sent to nursing home after admission with MRSA pneumonia, before that was living home. No smoking. No drinking. No illegal drug use.   MEDICATIONS:  Before admission:  1.  Spiriva 18 mcg inhalation once a day.  2.  Senna 1 tablet oral 2 times a day.  3.  Prednisone 5 mg oral once a day.  4.  Oxycodone 10 mg oral every 4 hours as needed.  5.  Omeprazole 40 mg oral 2 times a day.  6.  Nystatin 5 mL oral every 6 hours.  7.  Magnesium hydroxide 30 mL oral 2 times a day.  8.  Linezolid 600 mg oral tablet 2 times a day.  9.  Hydroxychloroquine 200 mg oral 2 times a day.  10.  Furosemide 20 mg oral tablet once a day.  11.  Folic acid 1 mg oral once a day.  12.  Docusate sodium 100 mg 2 capsules 2 times a day.  13.  Carvedilol 25 mg oral once a day.  14.  Aspirin 81 mg once a day.  15.  Albuterol and ipratropium 2.5 mg inhalation 3 mL 4 times a day.   VITALS: In ER, temperature 98.5, pulse 83, respirations 22, blood pressure 177/72, and pulse oximetry 95% on room air.   PHYSICAL EXAMINATION:  GENERAL: The patient is fully alert and oriented to time, place, and person; does not appear in  any acute distress.  HEENT: Head and neck atraumatic, conjunctivae pink, oral mucosa moist.  NECK: Supple, thyroid nontender, no JVD.  RESPIRATORY: Bilateral equal air entry, some crepitation present, no wheezing.  CARDIOVASCULAR: S1, S2 present, regular, no murmur.  ABDOMEN: Soft, nontender. Bowel sounds present. No organomegaly.  SKIN: No acne, rashes, or lesions.  MUSCULOSKELETAL: No tenderness or swelling.  EXTREMITIES: Legs, no edema.  NEUROLOGICAL: Power 4/5, generalized weakness, moves all 4 limbs and follows commands, no tremor or rigidity, no numbness.  PSYCHIATRIC: Does not appear in any acute psychiatric illness at this time.   IMPORTANT LABORATORY RESULTS:  1.  BNP is 17,597, glucose 96, BUN 15, creatinine 1.25, sodium 138, potassium 5.1, chloride 103, and CO2 of 30, total protein is 5.8, bilirubin 0.9, alkaline phosphatase 52, SGOT 47, and SGPT 26.  2.  CK total is 106.  3.  Troponin 0.10.  4.  WBC 9.6, hemoglobin 12.1, and platelet count is 160.  5.  INR is 1.  6.  Chest x-ray, PA and lateral, is done today and it was compared with chest x-ray and CT scan which were done recently in the last admission and there is a new moderate bilateral pleural effusion and basilar airspace disease, could be atelectasis or pneumonia.   ASSESSMENT AND PLAN: An 81 year old female who has recent history of methicillin resistant Staphylococcus aureus pneumonia, was discharged to nursing home, came back with worsening cough and feeling short of breath.  1.  Methicillin resistant Staphylococcus aureus pneumonia. We will collect sputum culture and treat with vancomycin intravenous for now and get pulmonary consult to guide with further treatment.  2.  Congestive heart failure. The patient does not have history of heart failure, but BNP is extremely elevated. She is feeling short of breath and has pleural effusion. There might be a component of congestive heart failure making this. We will, right now,  get echocardiogram to get idea about diastolic versus systolic, and we will give Lasix intravenous b.i.d. for now.  3.  Hypertension. Continue carvedilol for now.  4.  Elevated troponin. Most likely due to congestive heart failure and infection with pneumonia; we will follow 2 more times, but I do not think we need to give any interventions at this time, get echocardiogram.   Total Time Spent ON this admission was 50 minutes.    ____________________________ Hope Pigeon Elisabeth Pigeon, MD vgv:nt D: 12/04/2014 15:18:21 ET T: 12/04/2014 15:43:03 ET JOB#: [id number]  cc: Hope Pigeon. Elisabeth Pigeon, MD, <Dictator> Lyndon Code, MD Heath Gold [facility] MD ELECTRONICALLY SIGNED 12/18/2014 23:11

## 2015-03-25 NOTE — Consult Note (Signed)
   Comments   Family has decided on admission to hospice home for symptom management vs [facility].  Daughter would like for pt to be on lasix over at [facility].  Hospice liason notified.  Electronic Signatures: Shahab Polhamus, Jaquelyn Bitter (NP)  (Signed 23-Mar-16 11:00)  Authored: Palliative Care Phifer, Harriett Sine (MD)  (Signed 23-Mar-16 16:55)  Authored: Palliative Care   Last Updated: 23-Mar-16 16:55 by Phifer, Harriett Sine (MD)

## 2015-06-13 LAB — URINALYSIS, COMPLETE
Bilirubin,UR: NEGATIVE
Blood: NEGATIVE
GLUCOSE, UR: NEGATIVE mg/dL (ref 0–75)
Ketone: NEGATIVE
Leukocyte Esterase: NEGATIVE
NITRITE: NEGATIVE
Ph: 5 (ref 4.5–8.0)
Protein: NEGATIVE
SPECIFIC GRAVITY: 1.013 (ref 1.003–1.030)
Squamous Epithelial: NONE SEEN

## 2015-06-13 LAB — CBC
HCT: 27 % — ABNORMAL LOW (ref 35.0–47.0)
HGB: 8.6 g/dL — ABNORMAL LOW (ref 12.0–16.0)
MCH: 31 pg (ref 26.0–34.0)
MCHC: 31.9 g/dL — ABNORMAL LOW (ref 32.0–36.0)
MCV: 97 fL (ref 80–100)
PLATELETS: 162 10*3/uL (ref 150–440)
RBC: 2.78 10*6/uL — ABNORMAL LOW (ref 3.80–5.20)
RDW: 15.9 % — ABNORMAL HIGH (ref 11.5–14.5)
WBC: 5.4 10*3/uL (ref 3.6–11.0)

## 2015-06-13 LAB — BASIC METABOLIC PANEL
ANION GAP: 8 (ref 7–16)
BUN: 31 mg/dL — ABNORMAL HIGH
Calcium, Total: 8.5 mg/dL — ABNORMAL LOW
Chloride: 99 mmol/L — ABNORMAL LOW
Co2: 35 mmol/L — ABNORMAL HIGH
Creatinine: 1.83 mg/dL — ABNORMAL HIGH
EGFR (African American): 29 — ABNORMAL LOW
EGFR (Non-African Amer.): 25 — ABNORMAL LOW
GLUCOSE: 94 mg/dL
Potassium: 3.8 mmol/L
SODIUM: 142 mmol/L

## 2015-06-13 LAB — TROPONIN I: Troponin-I: 0.08 ng/mL — ABNORMAL HIGH

## 2015-08-08 IMAGING — US US GUIDE NEEDLE - US [PERSON_NAME]
1 series · 5 of 5 positions shown · non-contrast
Comparison: Chest x-rays 12/07/2014 and 12/04/2014

CLINICAL DATA: 84-year-old female with a suggestion of a bilateral
pleural effusions on recent chest radiography. Bilateral
thoracentesis ease were ordered. Initial plan was to perform one
side today, and one tomorrow. However, upon sonographic
interrogation there is no pleural fluid in the right hemi thorax.
There is a small left pleural effusion so left thoracentesis will be
performed.

EXAM:
ULTRASOUND GUIDED LEFT THORACENTESIS

[Series 1: us guide needle - us (person_name) · 0.25mm/px · 5 of 5 slices shown]
[im 1/5]
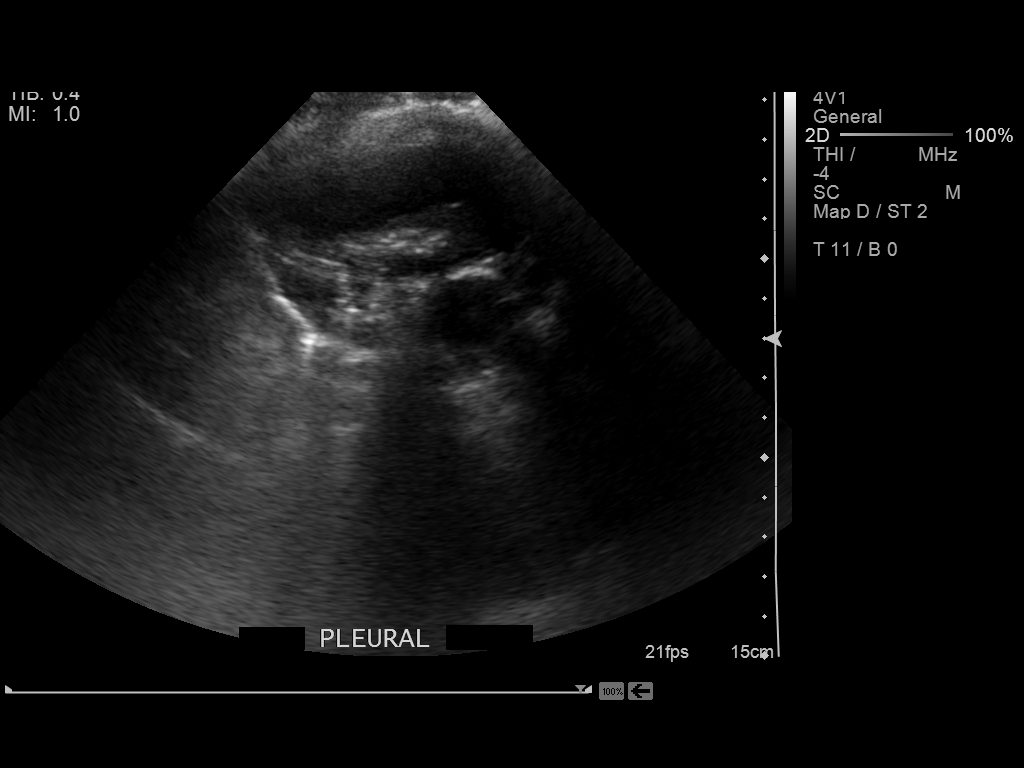
[im 2/5]
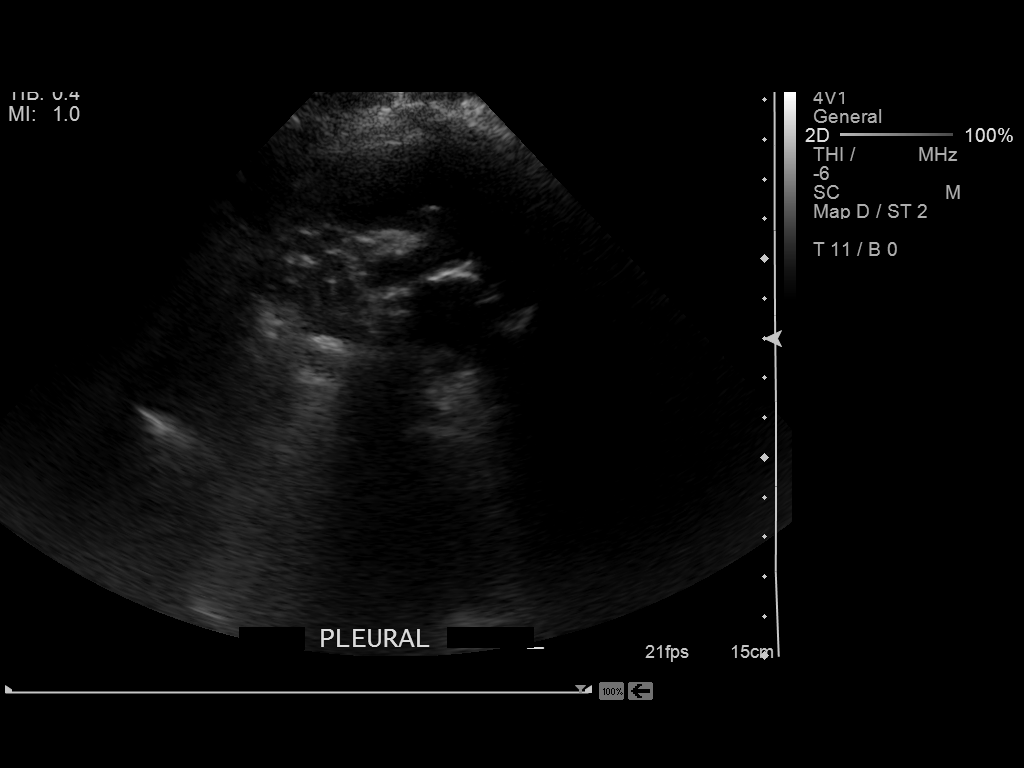
[im 3/5]
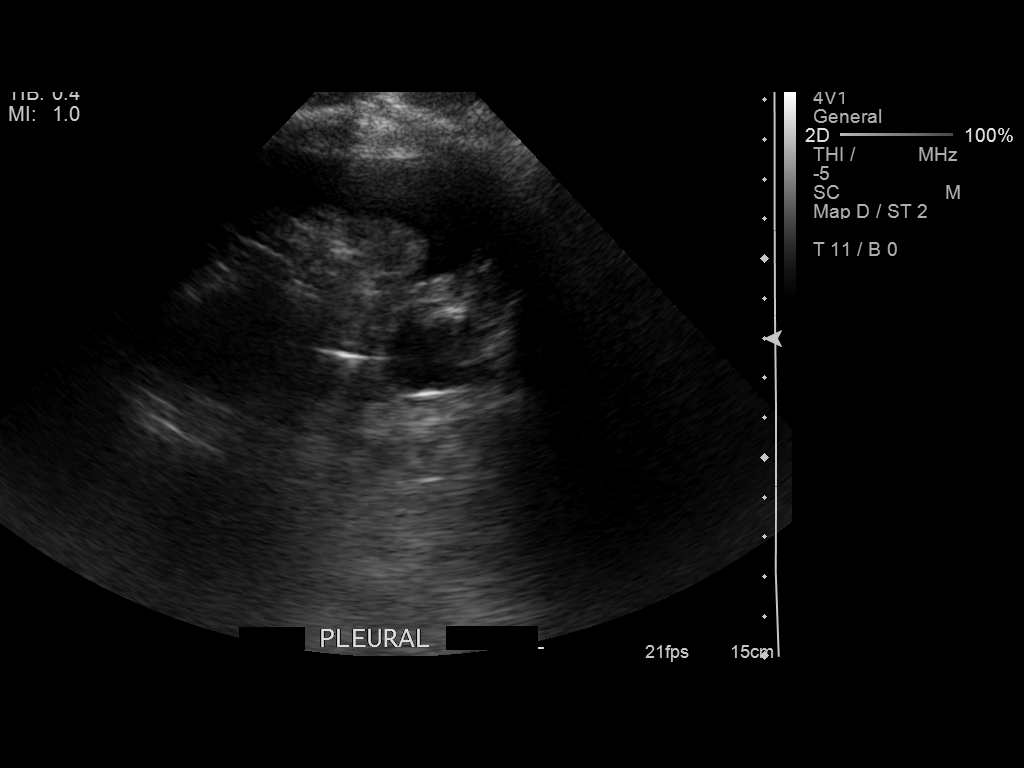
[im 4/5]
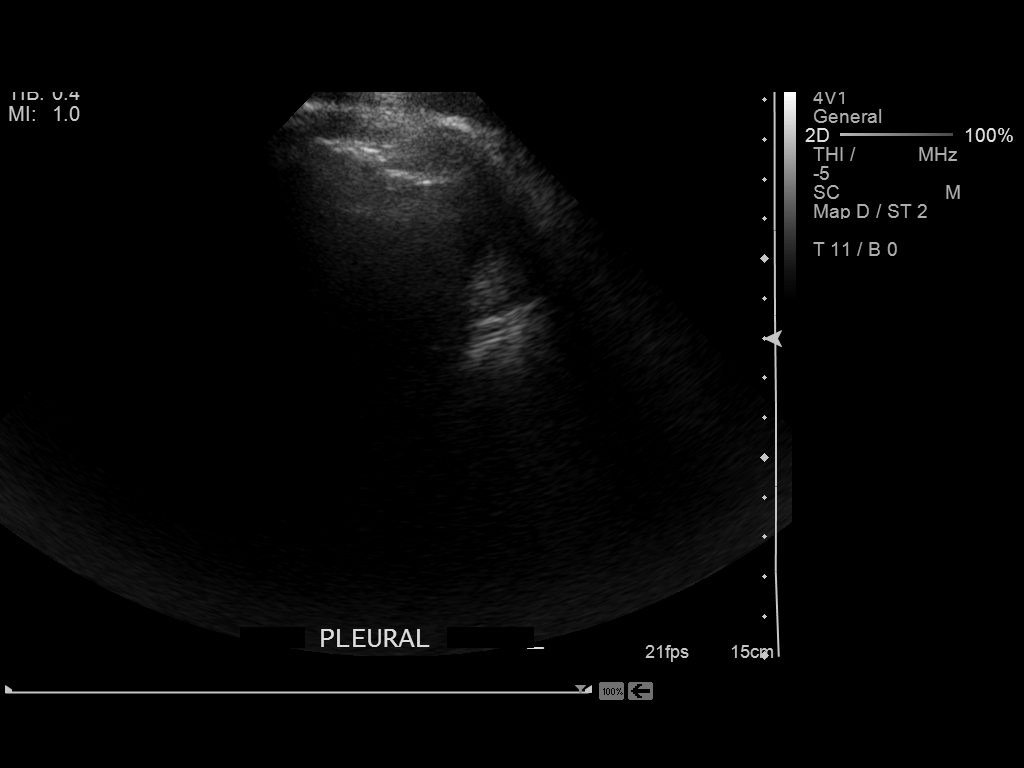
[im 5/5]
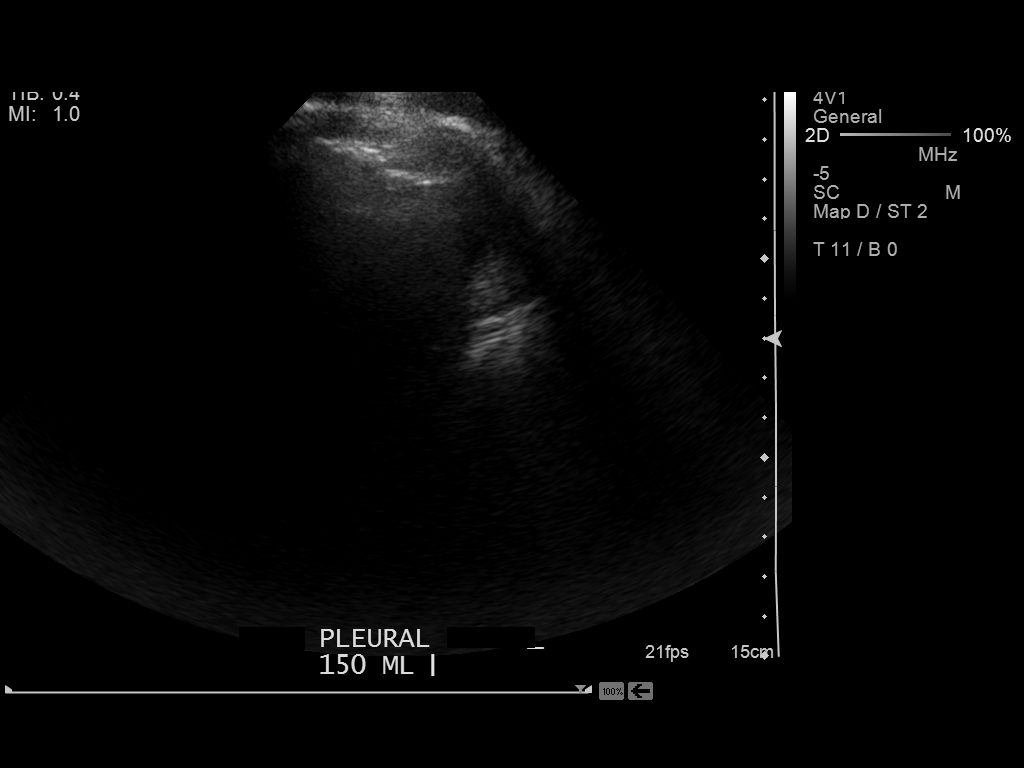

[5 of 5 positions shown; findings below may reference images not displayed]

PROCEDURE:
An ultrasound guided thoracentesis was thoroughly discussed with the
patient and questions answered. The benefits, risks, alternatives
and complications were also discussed. The patient understands and
wishes to proceed with the procedure. Written consent was obtained.

Ultrasound was performed to localize and mark an adequate pocket of
fluid in the left chest. The area was then prepped and draped in the
normal sterile fashion. 1% Lidocaine was used for local anesthesia.
Under ultrasound guidance a Safe T centesis catheter was introduced.
Thoracentesis was performed. The catheter was removed and a dressing
applied.

COMPLICATIONS:
None.
FINDINGS: A total of approximately 150 mL of clear yellow serous pleural fluid
was removed. A fluid sample was sent for laboratory analysis.
IMPRESSION: Successful ultrasound guided left thoracentesis yielding 150 mL
clear yellow serous pleural fluid.

## 2015-08-08 IMAGING — CR DG CHEST 1V
1 series · 2 of 2 positions shown · non-contrast
Comparison: Most recent chest x-ray 12/04/2014.

CLINICAL DATA: 84-year-old female with pneumonia and bilateral
pleural effusions.

EXAM:
CHEST - 1 VIEW

[Series 1: dxr chest specify lat or decub · 0.14mm/px · 2 of 2 slices shown]
[im 1/2]
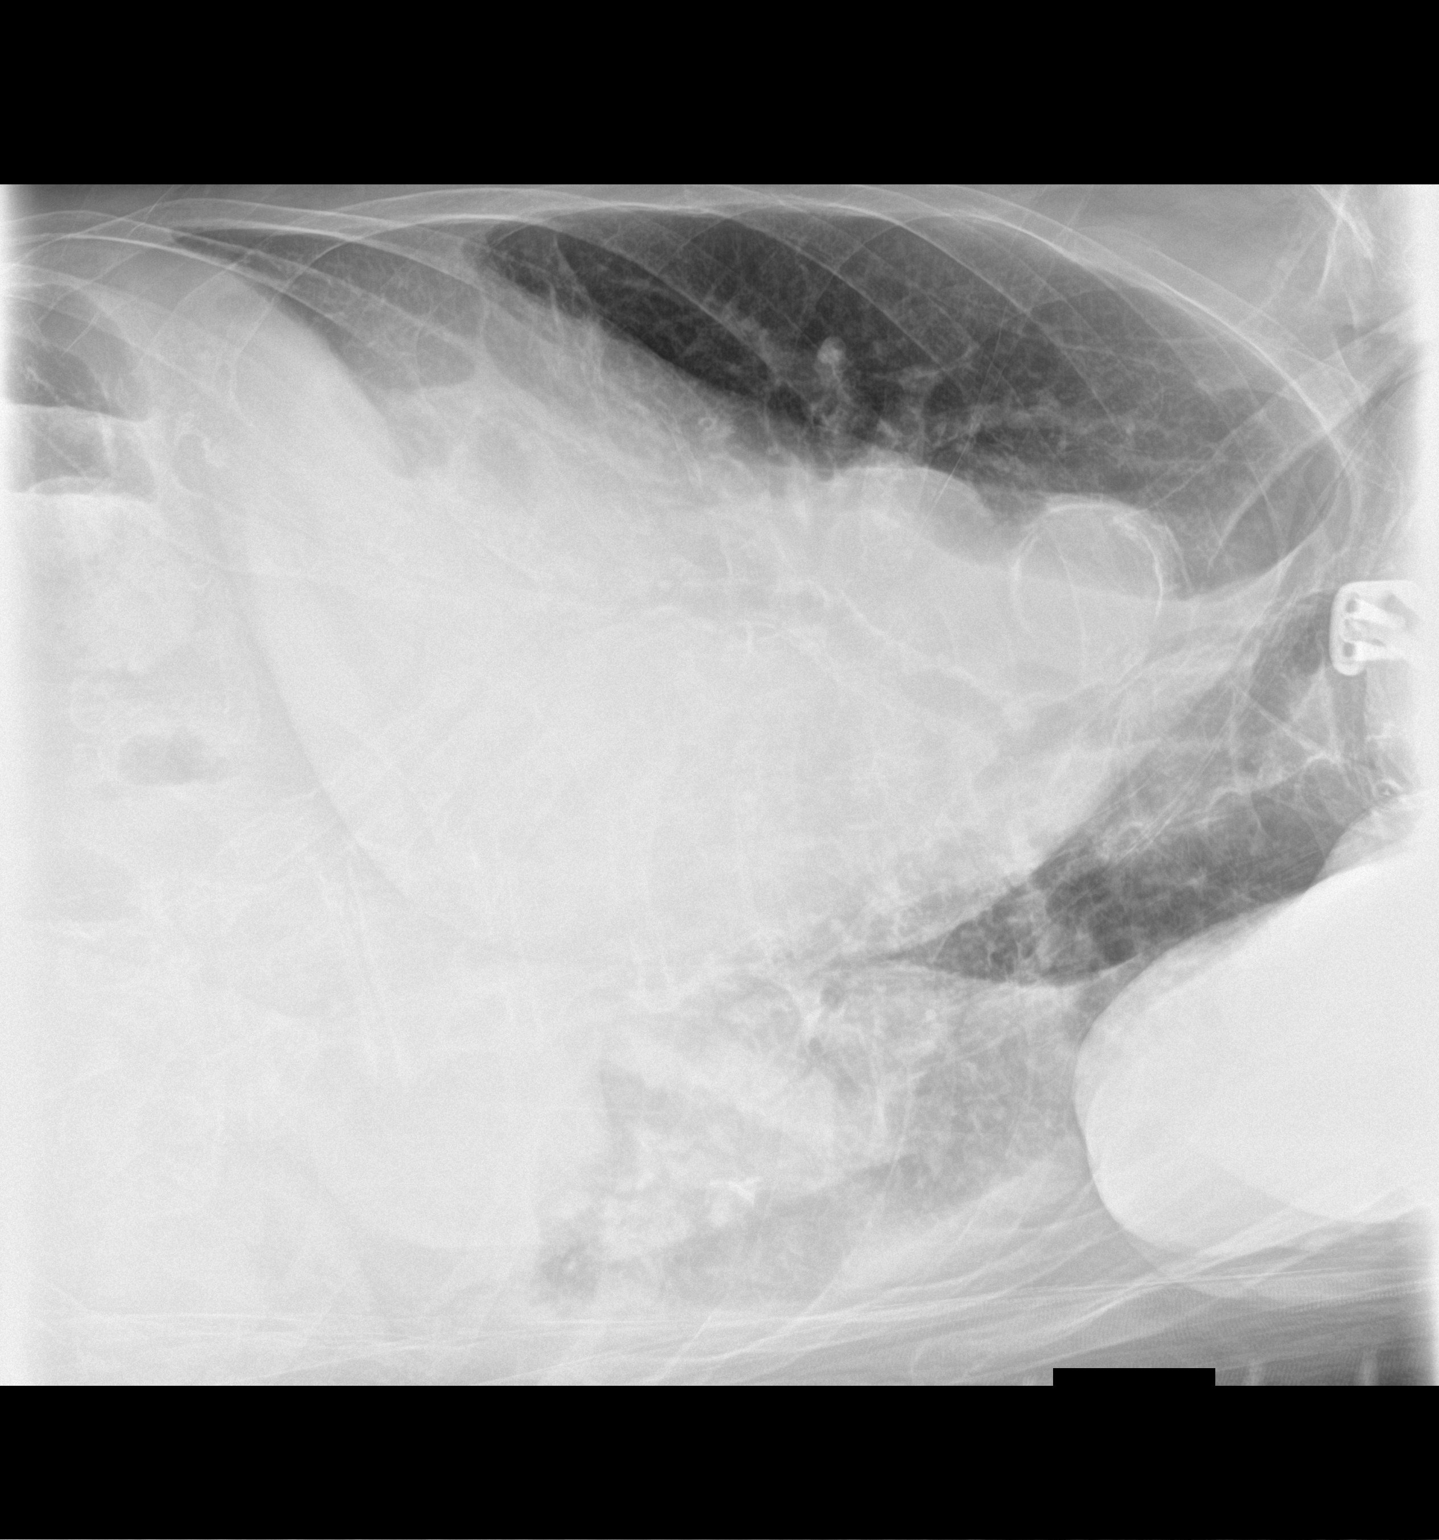
[im 2/2]
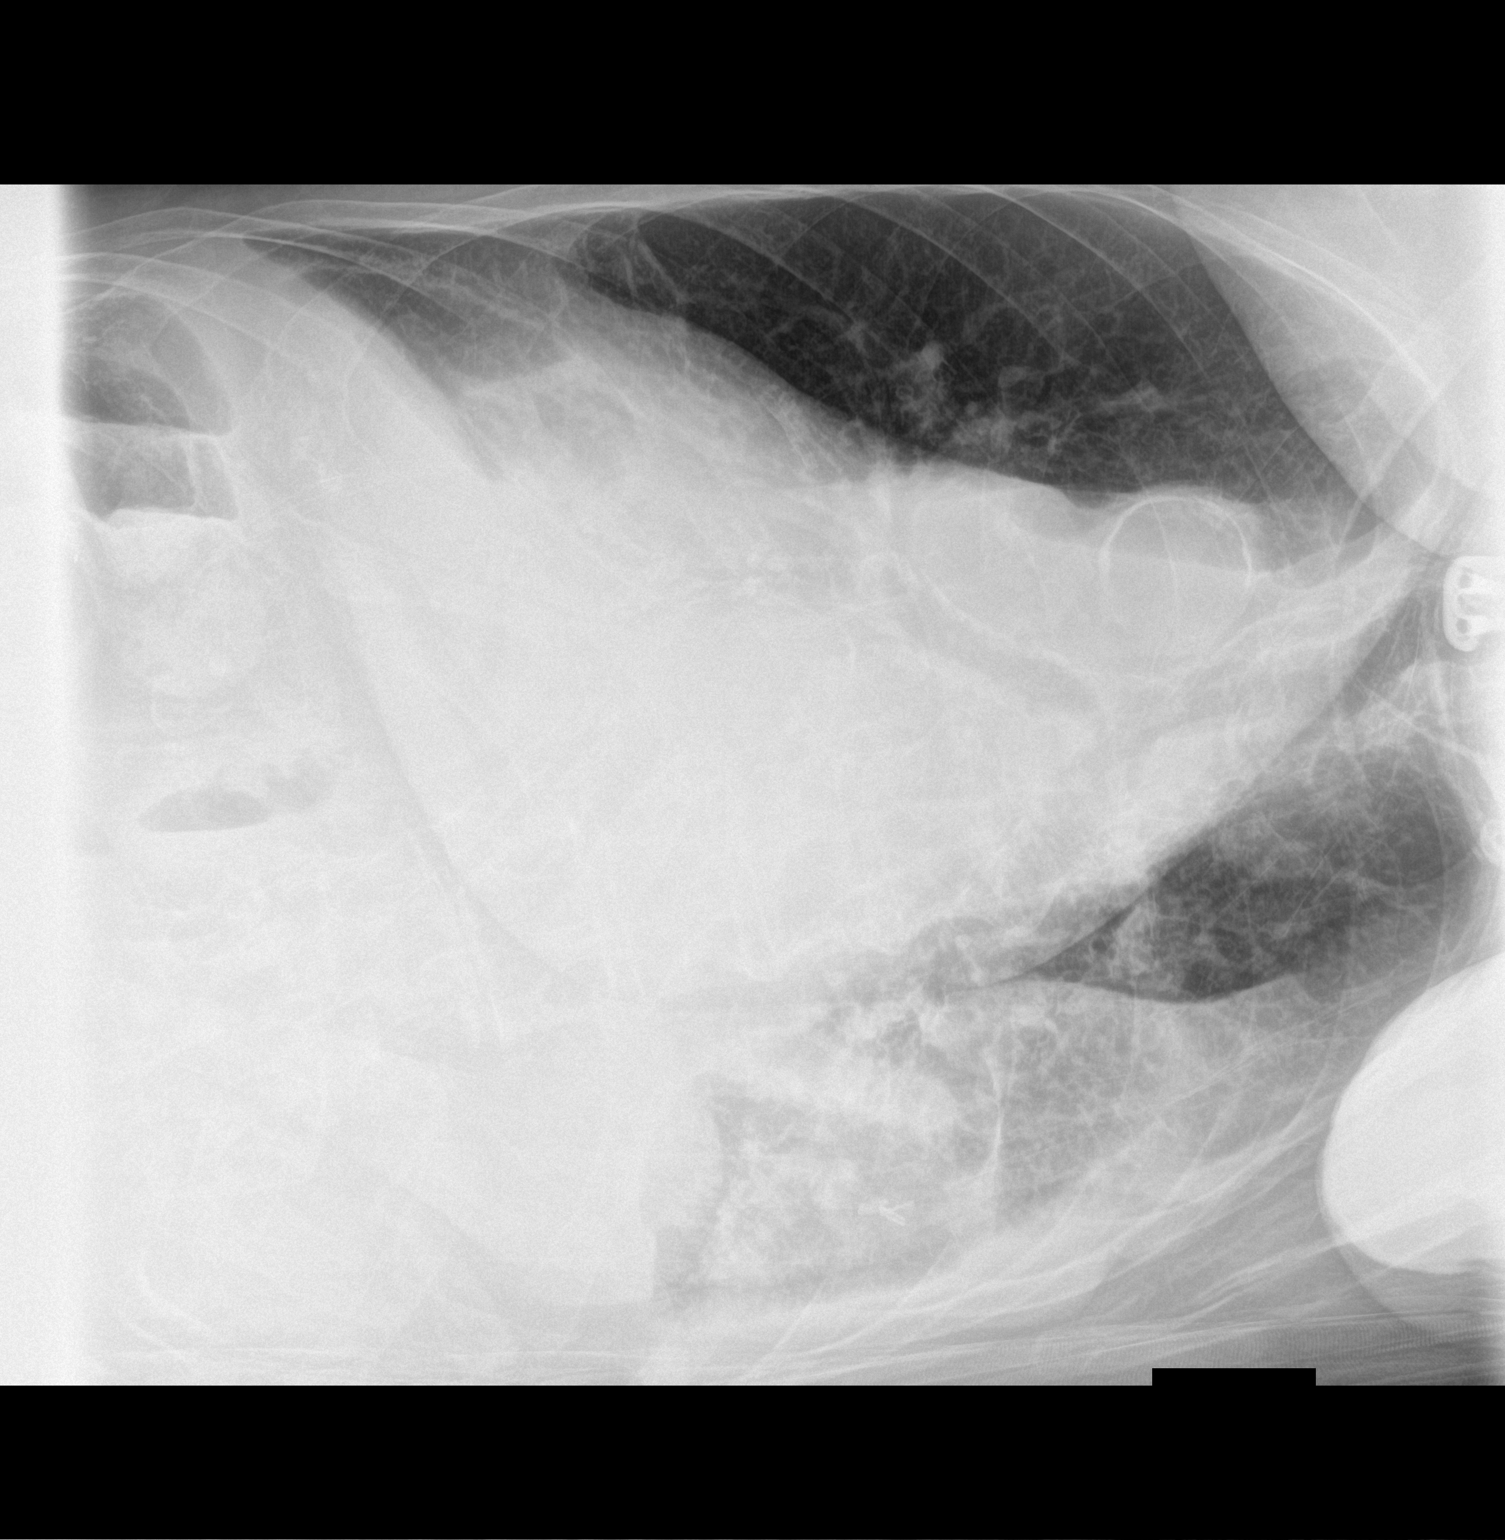

[2 of 2 positions shown; findings below may reference images not displayed]

FINDINGS: Layering bilateral pleural effusions. Bilateral lower lung
atelectasis. Cardiomegaly. Aortic atherosclerosis. No pulmonary
edema. No acute osseous abnormality.
IMPRESSION: Layering bilateral pleural effusions.

## 2015-08-08 IMAGING — CR DG CHEST POST BIOPSY - THORACENTESIS
1 series · 1 of 1 positions shown · non-contrast
Comparison: Prior chest x-ray is 12/04/2014 and 12/07/2014

CLINICAL DATA: 84-year-old female status post left-sided
thoracentesis

EXAM:
CHEST XRAY 1 VIEW

[x chest ap]
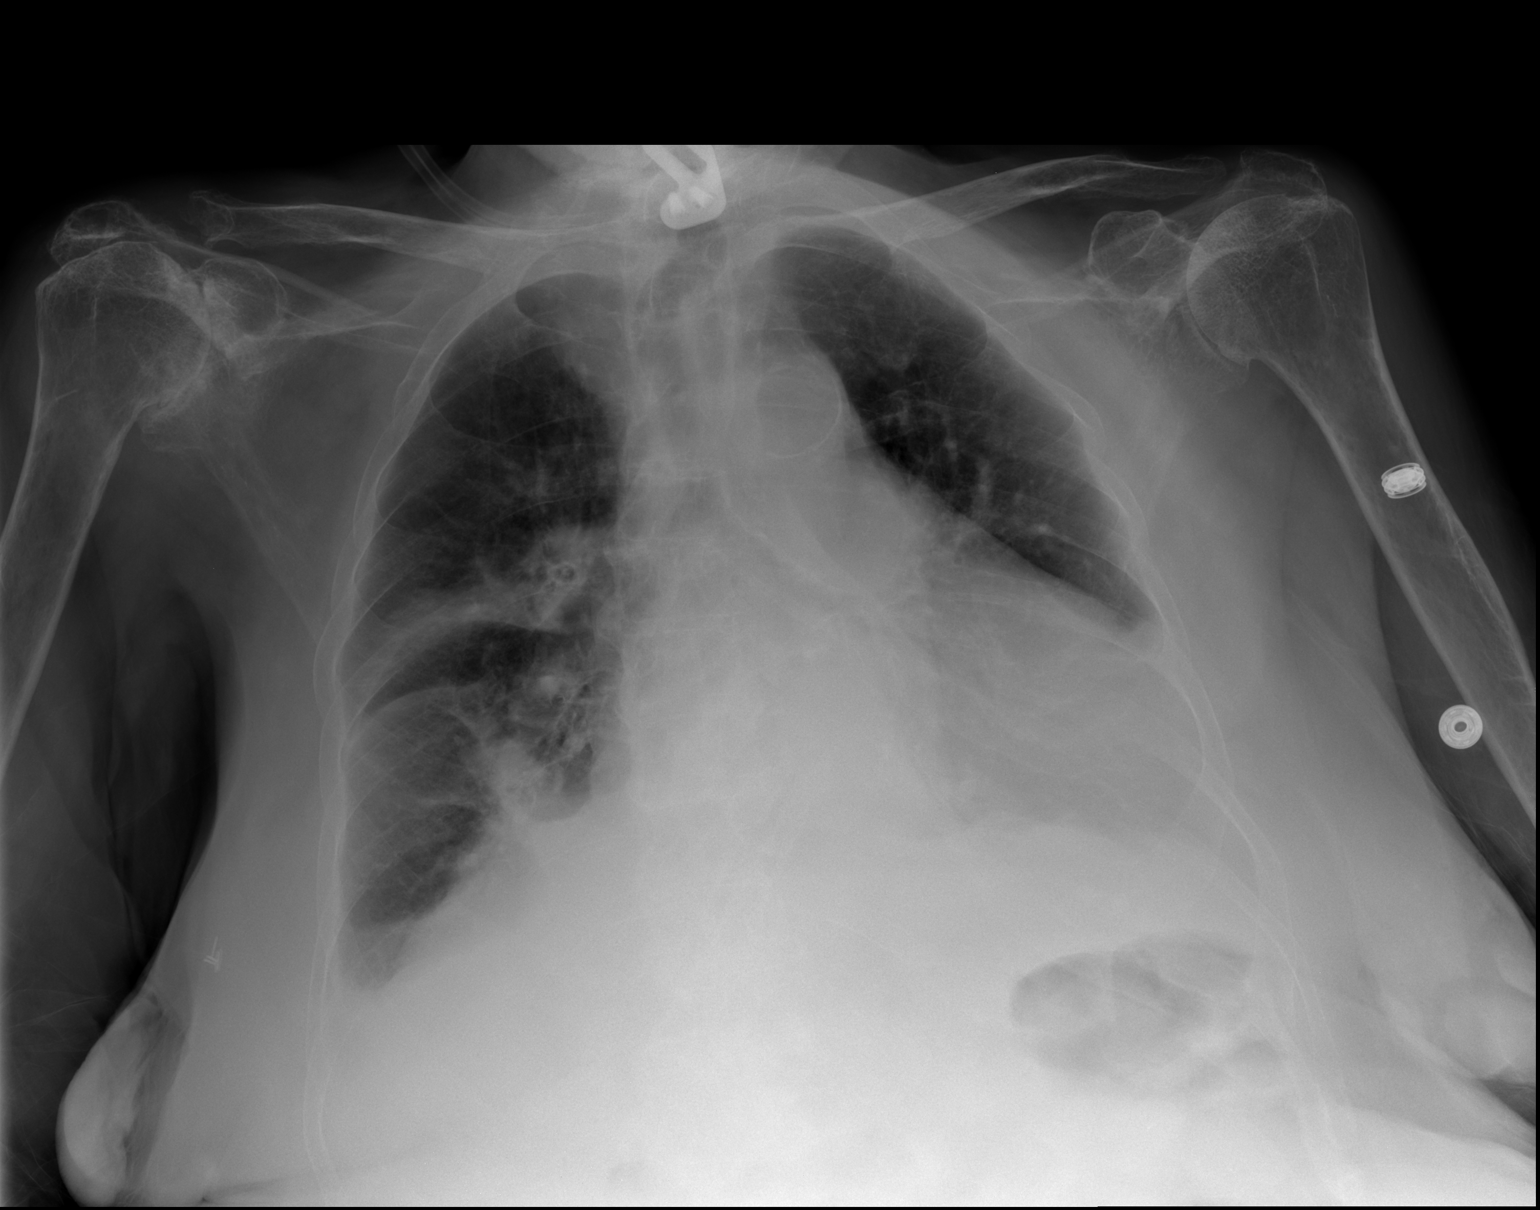

[1 of 1 positions shown; findings below may reference images not displayed]

FINDINGS: No evidence of left-sided pneumothorax. Slightly improved aeration
of the left lung base. Stable appearance of the right lung, cardiac
and mediastinal structures. Pulmonary vascular congestion appears
improved.
IMPRESSION: No evidence of left-sided pneumothorax following thoracentesis.

## 2015-09-12 IMAGING — CT CT CHEST W/O CM
2 of 4 series · 15 of 36 positions shown, 18 images · non-contrast
Comparison: Chest radiograph 12/07/2014, chest CT 11/19/2014

CLINICAL DATA: Difficulty breathing, history of asthma, history of
right breast cancer.

EXAM:
CT CHEST WITHOUT CONTRAST
TECHNIQUE: Multidetector CT imaging of the chest was performed following the
standard protocol without IV contrast..

[Series 2: routine chest wo · axial · 0.62mm/px · z∈[-116,+129]mm · 12 of 59 slices shown, 15 images]
[im 5/59  mediastinal]
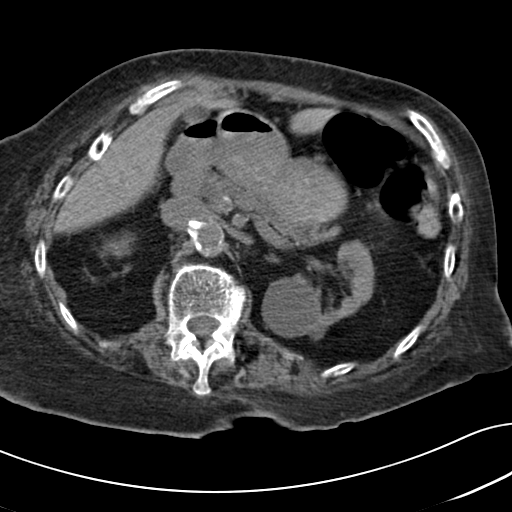
[im 5/59  lung]
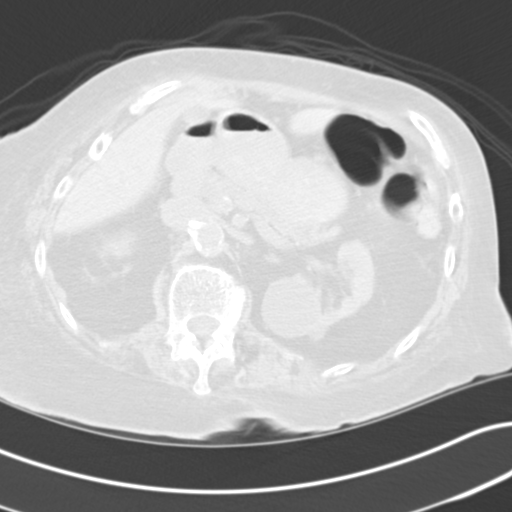
[im 9/59  lung]
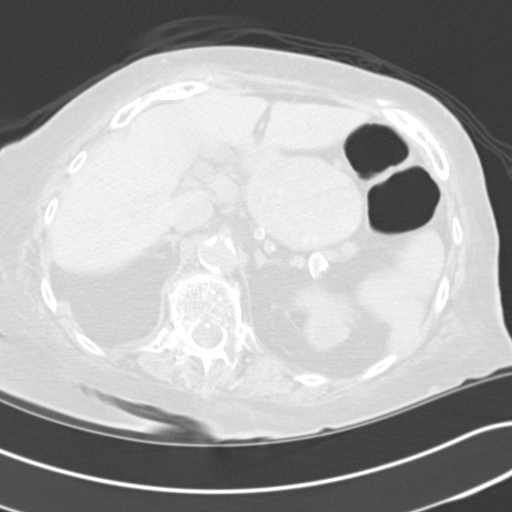
[im 14/59  lung]
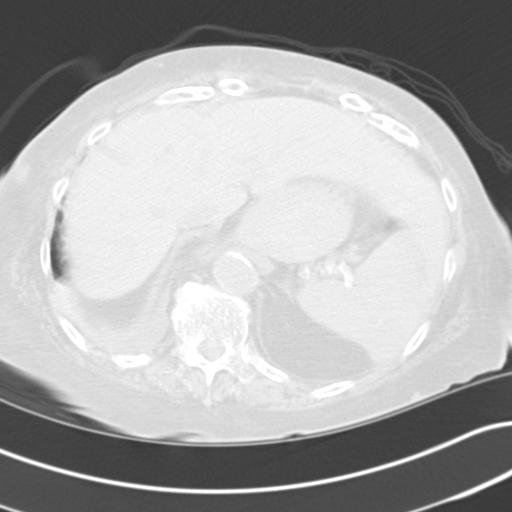
[im 18/59  lung]
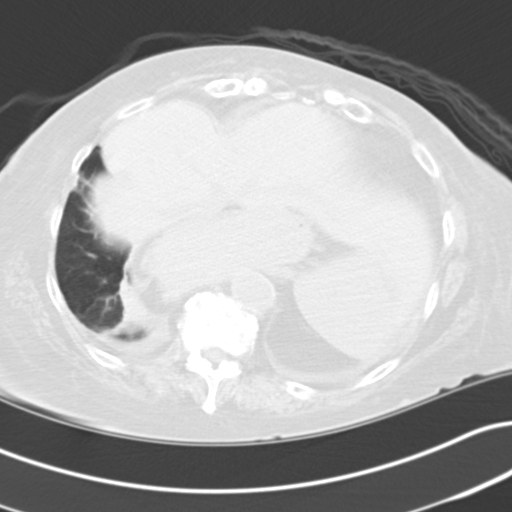
[im 23/59  mediastinal]
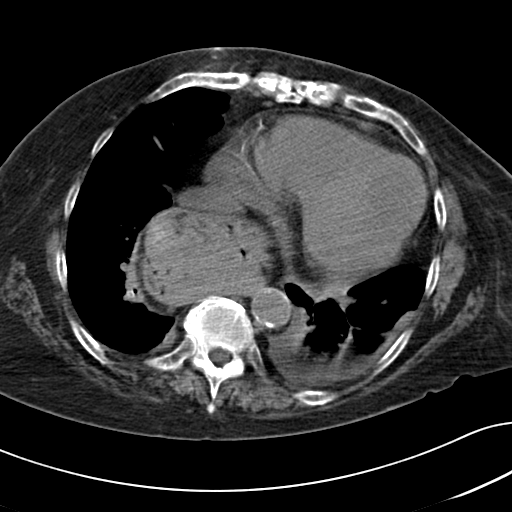
[im 23/59  lung]
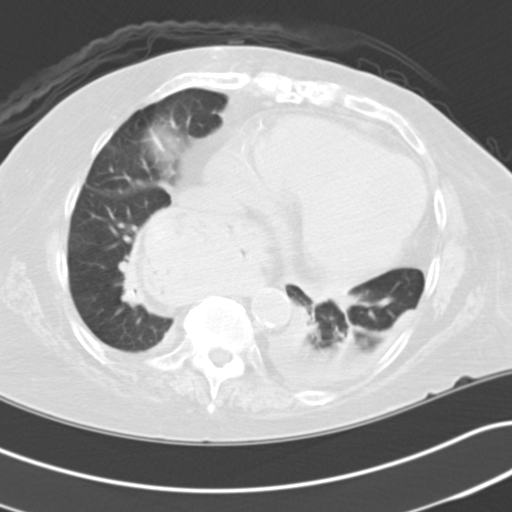
[im 27/59  lung]
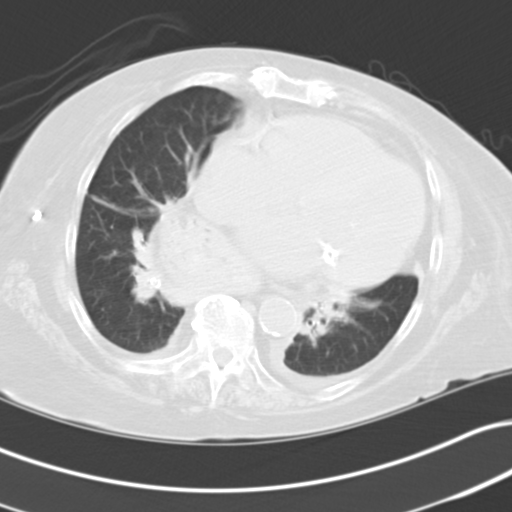
[im 32/59  lung]
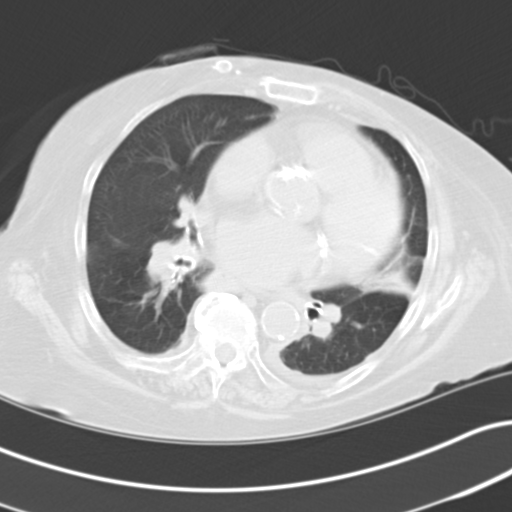
[im 36/59  lung]
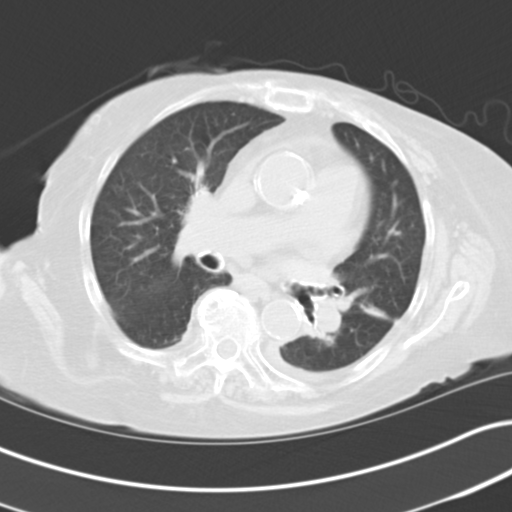
[im 41/59  mediastinal]
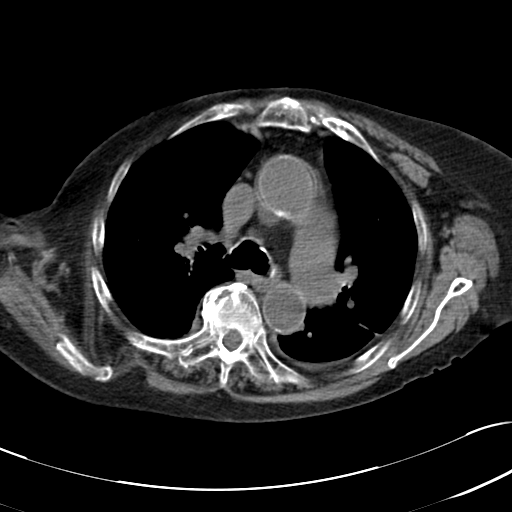
[im 41/59  lung]
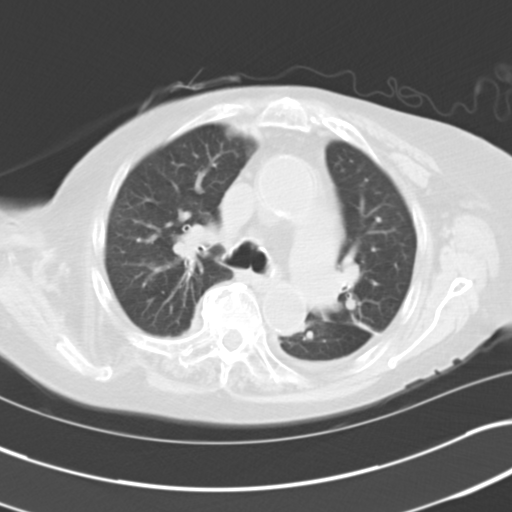
[im 45/59  lung]
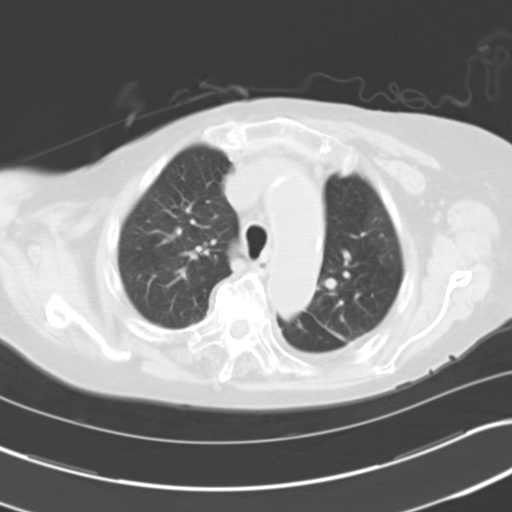
[im 50/59  lung]
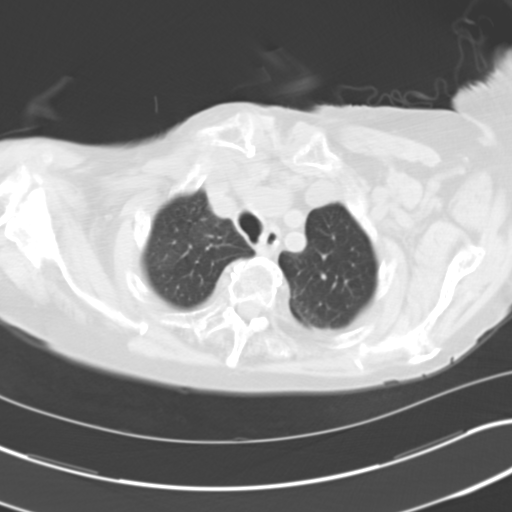
[im 54/59  lung]
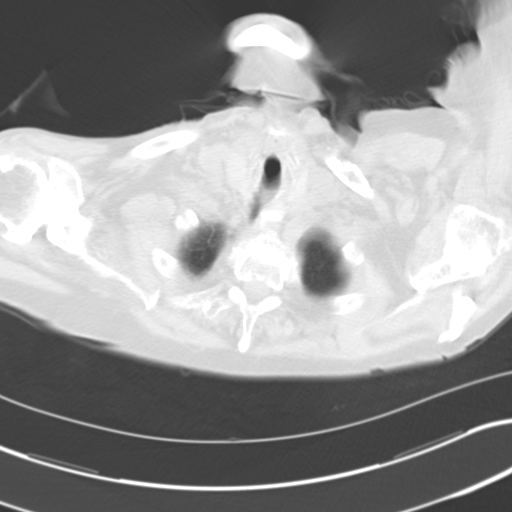

[Series 5: cor routine chest wo · coronal · 0.68mm/px · 3 of 148 slices shown]
[im 30/148  lung]
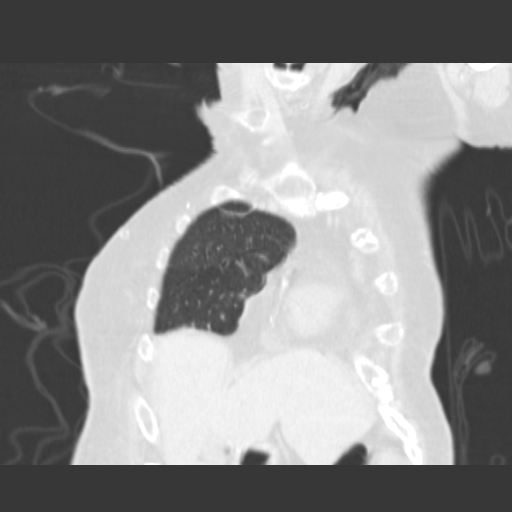
[im 59/148  lung]
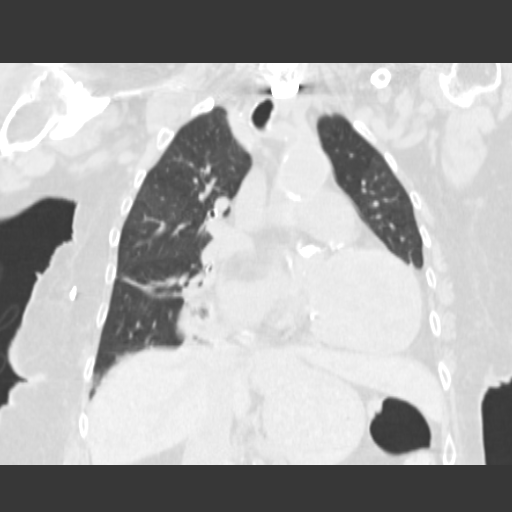
[im 89/148  lung]
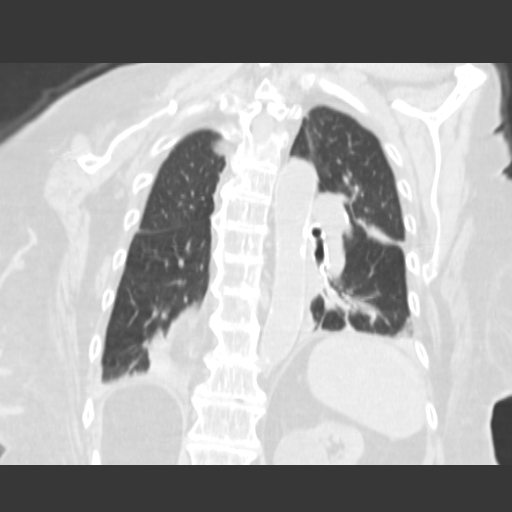

[15 of 36 positions shown; findings below may reference images not displayed]

FINDINGS: Mediastinum/Nodes: Moderate cardiomegaly is reidentified. Trace
pericardial fluid. Large hiatal hernia. Extensive atheromatous
aortic and coronary arterial calcifications are noted. Subjective
mild prominence of the aorta, myocardium, and great vessel major
branches as compared to the intraluminal blood density is
identified, which may occasionally be a secondary appearance of
anemia. Correlate clinically.

Lungs/Pleura: Mild central bronchial prominence without filling
defect. Curvilinear bibasilar atelectasis or scarring is noted, not
significantly changed. No new mass, nodule, or consolidation.

Upper abdomen: 3.5 cm dominant left renal cortical cyst is noted
with renal cortical lobulation bilaterally and left mid renal
hyperdense cyst.

Musculoskeletal: Bilateral shoulder degenerative change noted.
Cervical fusion hardware partly visualized. Bones are subjectively
osteopenic.
IMPRESSION: Stable central bronchial wall thickening without acute
cardiopulmonary process.

## 2015-10-16 IMAGING — CR DG CHEST 1V PORT
1 series · 1 of 1 positions shown · non-contrast
Comparison: 01/11/2015 CT. 12/07/2014 chest x-ray. More recent
chest x-rays currently unavailable.

CLINICAL DATA: 84-year-old female with shortness of breath and
left-sided chest pain. History of COPD and congestive heart failure.
History of breast cancer. Initial encounter.

EXAM:
PORTABLE CHEST - 1 VIEW

[ap]
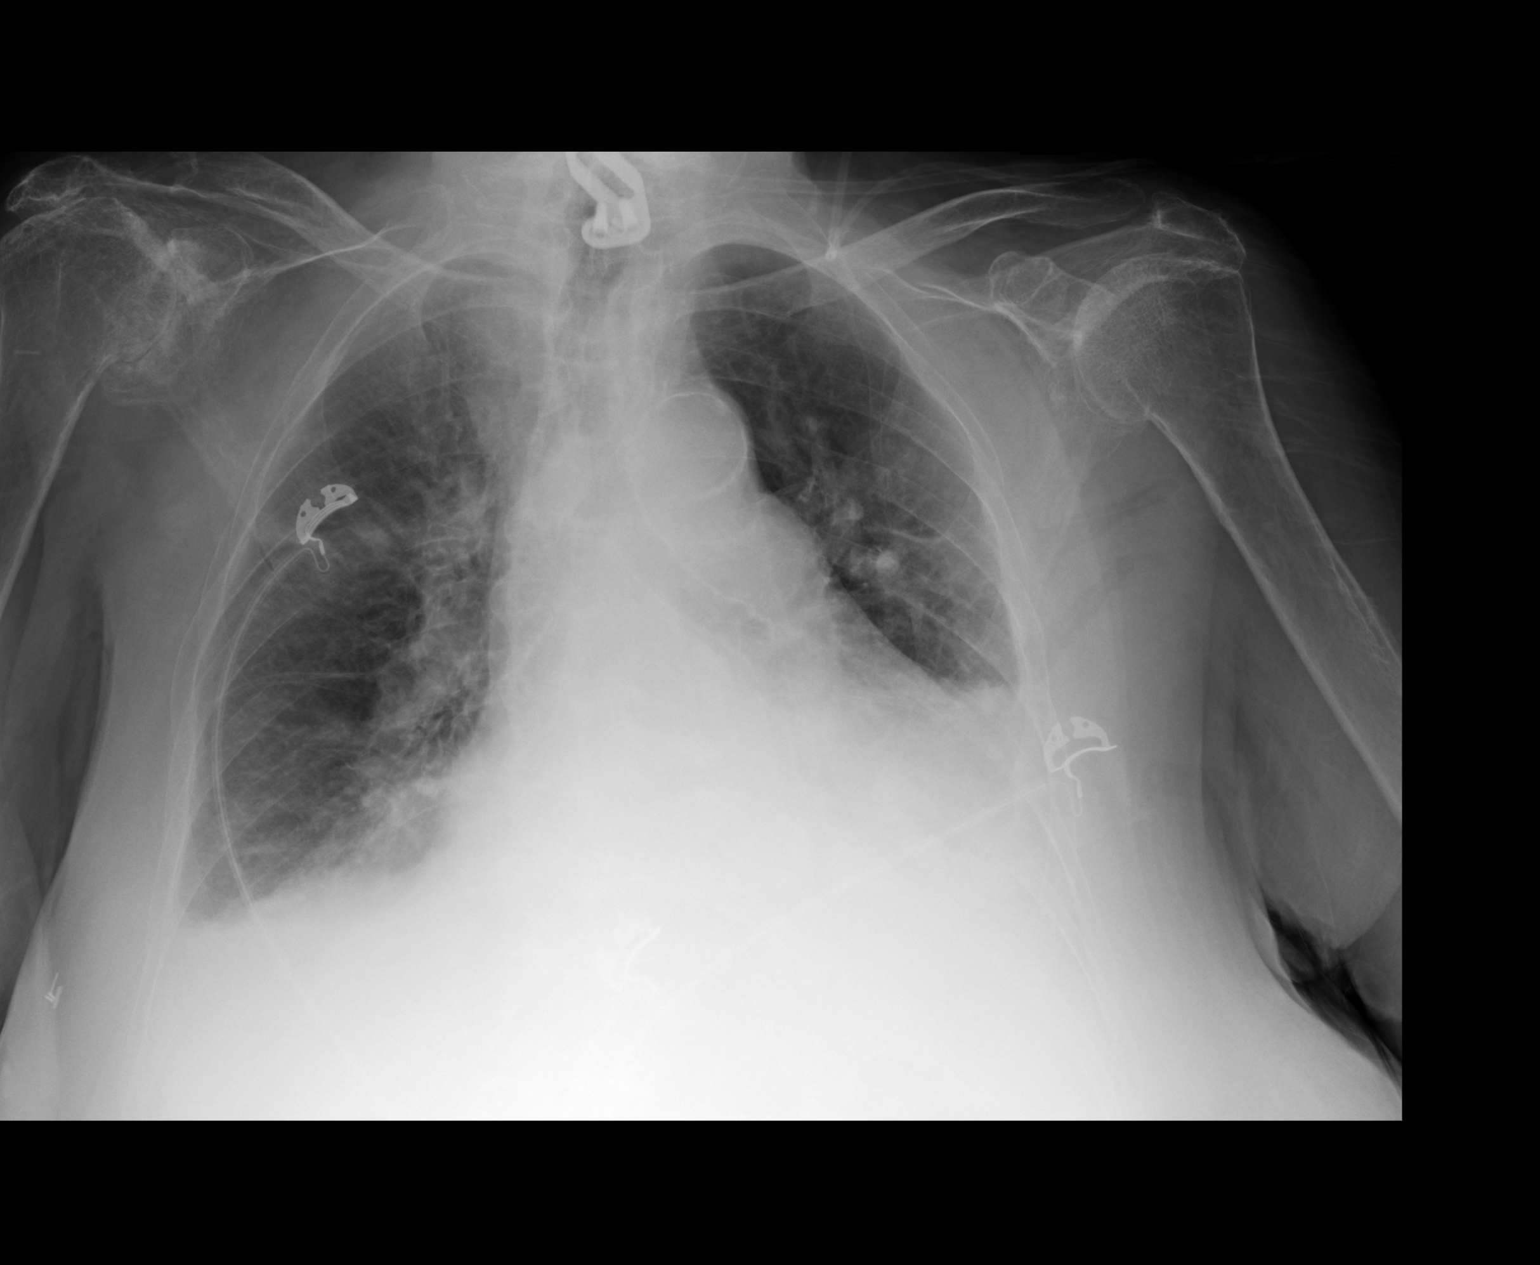

[1 of 1 positions shown; findings below may reference images not displayed]

FINDINGS: Cardiomegaly.

Small pleural effusions suspected greater on left. Associated
subsegmental atelectasis (limits evaluating for possibility of lower
lobe consolidation).

Pulmonary vascular congestion most notable centrally.

Bilateral shoulder joint degenerative changes much more notable on
the right.

Calcified slightly tortuous aorta.

Prior cervical spine surgery.
IMPRESSION: Presence of cardiomegaly, pulmonary vascular prominence (most
prominent centrally) and small bilateral effusions may indicate
presence of mild pulmonary edema.

Basilar subsegmental atelectasis.
